# Patient Record
Sex: Male | Born: 1935 | Race: White | Hispanic: No | Marital: Married | Smoking: Never smoker
Health system: Southern US, Community
[De-identification: ages and names within clinical notes are randomized; demographics above are authoritative.]

## PROBLEM LIST (undated history)

## (undated) DIAGNOSIS — R42 Dizziness and giddiness: Secondary | ICD-10-CM

## (undated) DIAGNOSIS — H3320 Serous retinal detachment, unspecified eye: Secondary | ICD-10-CM

## (undated) DIAGNOSIS — K5790 Diverticulosis of intestine, part unspecified, without perforation or abscess without bleeding: Secondary | ICD-10-CM

## (undated) DIAGNOSIS — R001 Bradycardia, unspecified: Secondary | ICD-10-CM

## (undated) DIAGNOSIS — E785 Hyperlipidemia, unspecified: Secondary | ICD-10-CM

## (undated) HISTORY — DX: Bradycardia, unspecified: R00.1

## (undated) HISTORY — PX: CATARACT EXTRACTION: SUR2

## (undated) HISTORY — PX: RETINAL DETACHMENT SURGERY: SHX105

## (undated) HISTORY — DX: Dizziness and giddiness: R42

---

## 1997-11-08 ENCOUNTER — Encounter: Admission: RE | Admit: 1997-11-08 | Discharge: 1998-02-06 | Payer: Self-pay | Admitting: *Deleted

## 1999-02-20 ENCOUNTER — Encounter: Admission: RE | Admit: 1999-02-20 | Discharge: 1999-05-21 | Payer: Self-pay | Admitting: *Deleted

## 2002-04-21 ENCOUNTER — Ambulatory Visit (HOSPITAL_COMMUNITY): Admission: RE | Admit: 2002-04-21 | Discharge: 2002-04-21 | Payer: Self-pay | Admitting: Gastroenterology

## 2011-08-06 DIAGNOSIS — R059 Cough, unspecified: Secondary | ICD-10-CM | POA: Diagnosis not present

## 2011-08-06 DIAGNOSIS — R05 Cough: Secondary | ICD-10-CM | POA: Diagnosis not present

## 2011-09-24 DIAGNOSIS — H4011X0 Primary open-angle glaucoma, stage unspecified: Secondary | ICD-10-CM | POA: Diagnosis not present

## 2011-09-24 DIAGNOSIS — Z9889 Other specified postprocedural states: Secondary | ICD-10-CM | POA: Diagnosis not present

## 2011-09-24 DIAGNOSIS — H2510 Age-related nuclear cataract, unspecified eye: Secondary | ICD-10-CM | POA: Diagnosis not present

## 2011-09-26 DIAGNOSIS — H4011X0 Primary open-angle glaucoma, stage unspecified: Secondary | ICD-10-CM | POA: Diagnosis not present

## 2011-09-26 DIAGNOSIS — H2510 Age-related nuclear cataract, unspecified eye: Secondary | ICD-10-CM | POA: Diagnosis not present

## 2011-09-26 DIAGNOSIS — Z9889 Other specified postprocedural states: Secondary | ICD-10-CM | POA: Diagnosis not present

## 2011-11-21 DIAGNOSIS — H40019 Open angle with borderline findings, low risk, unspecified eye: Secondary | ICD-10-CM | POA: Diagnosis not present

## 2011-12-03 DIAGNOSIS — N400 Enlarged prostate without lower urinary tract symptoms: Secondary | ICD-10-CM | POA: Diagnosis not present

## 2011-12-03 DIAGNOSIS — K219 Gastro-esophageal reflux disease without esophagitis: Secondary | ICD-10-CM | POA: Diagnosis not present

## 2011-12-03 DIAGNOSIS — M542 Cervicalgia: Secondary | ICD-10-CM | POA: Diagnosis not present

## 2011-12-03 DIAGNOSIS — Z0000 Encounter for general adult medical examination without abnormal findings: Secondary | ICD-10-CM | POA: Diagnosis not present

## 2011-12-03 DIAGNOSIS — Z125 Encounter for screening for malignant neoplasm of prostate: Secondary | ICD-10-CM | POA: Diagnosis not present

## 2011-12-03 DIAGNOSIS — Z79899 Other long term (current) drug therapy: Secondary | ICD-10-CM | POA: Diagnosis not present

## 2011-12-03 DIAGNOSIS — E7800 Pure hypercholesterolemia, unspecified: Secondary | ICD-10-CM | POA: Diagnosis not present

## 2011-12-03 DIAGNOSIS — M109 Gout, unspecified: Secondary | ICD-10-CM | POA: Diagnosis not present

## 2011-12-17 DIAGNOSIS — Z9889 Other specified postprocedural states: Secondary | ICD-10-CM | POA: Diagnosis not present

## 2011-12-17 DIAGNOSIS — H2510 Age-related nuclear cataract, unspecified eye: Secondary | ICD-10-CM | POA: Diagnosis not present

## 2011-12-17 DIAGNOSIS — H409 Unspecified glaucoma: Secondary | ICD-10-CM | POA: Diagnosis not present

## 2011-12-17 DIAGNOSIS — H40019 Open angle with borderline findings, low risk, unspecified eye: Secondary | ICD-10-CM | POA: Diagnosis not present

## 2011-12-17 DIAGNOSIS — H4011X0 Primary open-angle glaucoma, stage unspecified: Secondary | ICD-10-CM | POA: Diagnosis not present

## 2012-01-29 DIAGNOSIS — M545 Low back pain: Secondary | ICD-10-CM | POA: Diagnosis not present

## 2012-02-20 DIAGNOSIS — M545 Low back pain: Secondary | ICD-10-CM | POA: Diagnosis not present

## 2012-04-18 DIAGNOSIS — Z23 Encounter for immunization: Secondary | ICD-10-CM | POA: Diagnosis not present

## 2012-05-16 DIAGNOSIS — M542 Cervicalgia: Secondary | ICD-10-CM | POA: Diagnosis not present

## 2012-05-22 DIAGNOSIS — M542 Cervicalgia: Secondary | ICD-10-CM | POA: Diagnosis not present

## 2012-05-23 DIAGNOSIS — H4011X0 Primary open-angle glaucoma, stage unspecified: Secondary | ICD-10-CM | POA: Diagnosis not present

## 2012-05-23 DIAGNOSIS — H2510 Age-related nuclear cataract, unspecified eye: Secondary | ICD-10-CM | POA: Diagnosis not present

## 2012-06-02 DIAGNOSIS — M9981 Other biomechanical lesions of cervical region: Secondary | ICD-10-CM | POA: Diagnosis not present

## 2012-06-02 DIAGNOSIS — M542 Cervicalgia: Secondary | ICD-10-CM | POA: Diagnosis not present

## 2012-06-04 DIAGNOSIS — M9981 Other biomechanical lesions of cervical region: Secondary | ICD-10-CM | POA: Diagnosis not present

## 2012-06-04 DIAGNOSIS — M542 Cervicalgia: Secondary | ICD-10-CM | POA: Diagnosis not present

## 2012-06-06 DIAGNOSIS — M542 Cervicalgia: Secondary | ICD-10-CM | POA: Diagnosis not present

## 2012-06-06 DIAGNOSIS — M9981 Other biomechanical lesions of cervical region: Secondary | ICD-10-CM | POA: Diagnosis not present

## 2012-06-23 DIAGNOSIS — M47812 Spondylosis without myelopathy or radiculopathy, cervical region: Secondary | ICD-10-CM | POA: Diagnosis not present

## 2012-06-23 DIAGNOSIS — M109 Gout, unspecified: Secondary | ICD-10-CM | POA: Diagnosis not present

## 2012-06-27 DIAGNOSIS — M47812 Spondylosis without myelopathy or radiculopathy, cervical region: Secondary | ICD-10-CM | POA: Diagnosis not present

## 2012-07-02 DIAGNOSIS — M47812 Spondylosis without myelopathy or radiculopathy, cervical region: Secondary | ICD-10-CM | POA: Diagnosis not present

## 2012-07-11 DIAGNOSIS — M47812 Spondylosis without myelopathy or radiculopathy, cervical region: Secondary | ICD-10-CM | POA: Diagnosis not present

## 2012-07-11 DIAGNOSIS — M109 Gout, unspecified: Secondary | ICD-10-CM | POA: Diagnosis not present

## 2012-07-14 DIAGNOSIS — H40019 Open angle with borderline findings, low risk, unspecified eye: Secondary | ICD-10-CM | POA: Diagnosis not present

## 2012-07-14 DIAGNOSIS — H4011X0 Primary open-angle glaucoma, stage unspecified: Secondary | ICD-10-CM | POA: Diagnosis not present

## 2012-07-14 DIAGNOSIS — Z9889 Other specified postprocedural states: Secondary | ICD-10-CM | POA: Diagnosis not present

## 2012-07-14 DIAGNOSIS — H2510 Age-related nuclear cataract, unspecified eye: Secondary | ICD-10-CM | POA: Diagnosis not present

## 2012-07-17 DIAGNOSIS — M47812 Spondylosis without myelopathy or radiculopathy, cervical region: Secondary | ICD-10-CM | POA: Diagnosis not present

## 2012-12-31 DIAGNOSIS — Z125 Encounter for screening for malignant neoplasm of prostate: Secondary | ICD-10-CM | POA: Diagnosis not present

## 2012-12-31 DIAGNOSIS — J309 Allergic rhinitis, unspecified: Secondary | ICD-10-CM | POA: Diagnosis not present

## 2012-12-31 DIAGNOSIS — Z79899 Other long term (current) drug therapy: Secondary | ICD-10-CM | POA: Diagnosis not present

## 2012-12-31 DIAGNOSIS — Z0000 Encounter for general adult medical examination without abnormal findings: Secondary | ICD-10-CM | POA: Diagnosis not present

## 2012-12-31 DIAGNOSIS — K219 Gastro-esophageal reflux disease without esophagitis: Secondary | ICD-10-CM | POA: Diagnosis not present

## 2012-12-31 DIAGNOSIS — R252 Cramp and spasm: Secondary | ICD-10-CM | POA: Diagnosis not present

## 2012-12-31 DIAGNOSIS — M109 Gout, unspecified: Secondary | ICD-10-CM | POA: Diagnosis not present

## 2012-12-31 DIAGNOSIS — E7800 Pure hypercholesterolemia, unspecified: Secondary | ICD-10-CM | POA: Diagnosis not present

## 2013-01-13 DIAGNOSIS — N509 Disorder of male genital organs, unspecified: Secondary | ICD-10-CM | POA: Diagnosis not present

## 2013-02-04 DIAGNOSIS — H4011X0 Primary open-angle glaucoma, stage unspecified: Secondary | ICD-10-CM | POA: Diagnosis not present

## 2013-03-11 DIAGNOSIS — H409 Unspecified glaucoma: Secondary | ICD-10-CM | POA: Diagnosis not present

## 2013-03-11 DIAGNOSIS — Z9889 Other specified postprocedural states: Secondary | ICD-10-CM | POA: Diagnosis not present

## 2013-03-11 DIAGNOSIS — H40019 Open angle with borderline findings, low risk, unspecified eye: Secondary | ICD-10-CM | POA: Diagnosis not present

## 2013-03-11 DIAGNOSIS — H4010X0 Unspecified open-angle glaucoma, stage unspecified: Secondary | ICD-10-CM | POA: Diagnosis not present

## 2013-03-11 DIAGNOSIS — H2510 Age-related nuclear cataract, unspecified eye: Secondary | ICD-10-CM | POA: Diagnosis not present

## 2013-03-11 DIAGNOSIS — H4011X0 Primary open-angle glaucoma, stage unspecified: Secondary | ICD-10-CM | POA: Diagnosis not present

## 2013-03-30 ENCOUNTER — Encounter (HOSPITAL_COMMUNITY): Payer: Self-pay | Admitting: Emergency Medicine

## 2013-03-30 ENCOUNTER — Emergency Department (HOSPITAL_COMMUNITY): Payer: Medicare Other

## 2013-03-30 ENCOUNTER — Emergency Department (HOSPITAL_COMMUNITY)
Admission: EM | Admit: 2013-03-30 | Discharge: 2013-03-30 | Disposition: A | Discharge status: Home / Self Care | Payer: Medicare Other | Attending: Emergency Medicine | Admitting: Emergency Medicine

## 2013-03-30 DIAGNOSIS — R0789 Other chest pain: Secondary | ICD-10-CM | POA: Diagnosis not present

## 2013-03-30 DIAGNOSIS — Z7982 Long term (current) use of aspirin: Secondary | ICD-10-CM | POA: Insufficient documentation

## 2013-03-30 DIAGNOSIS — Z79899 Other long term (current) drug therapy: Secondary | ICD-10-CM | POA: Diagnosis not present

## 2013-03-30 DIAGNOSIS — E785 Hyperlipidemia, unspecified: Secondary | ICD-10-CM | POA: Insufficient documentation

## 2013-03-30 DIAGNOSIS — R079 Chest pain, unspecified: Secondary | ICD-10-CM | POA: Diagnosis not present

## 2013-03-30 HISTORY — DX: Serous retinal detachment, unspecified eye: H33.20

## 2013-03-30 HISTORY — DX: Diverticulosis of intestine, part unspecified, without perforation or abscess without bleeding: K57.90

## 2013-03-30 HISTORY — DX: Hyperlipidemia, unspecified: E78.5

## 2013-03-30 LAB — CBC
Hemoglobin: 16 g/dL (ref 13.0–17.0)
MCV: 89.9 fL (ref 78.0–100.0)
Platelets: 225 10*3/uL (ref 150–400)
RBC: 4.95 MIL/uL (ref 4.22–5.81)
WBC: 8.9 10*3/uL (ref 4.0–10.5)

## 2013-03-30 LAB — POCT I-STAT TROPONIN I: Troponin i, poc: 0 ng/mL (ref 0.00–0.08)

## 2013-03-30 LAB — BASIC METABOLIC PANEL
CO2: 24 mEq/L (ref 19–32)
Chloride: 98 mEq/L (ref 96–112)
Glucose, Bld: 93 mg/dL (ref 70–99)
Sodium: 132 mEq/L — ABNORMAL LOW (ref 135–145)

## 2013-03-30 NOTE — ED Notes (Signed)
Patient is alert and orientedx4.  Patient was explained discharge instructions and they understood them with no questions.  The patient's family is taking the patient home. 

## 2013-03-30 NOTE — ED Provider Notes (Signed)
Care assumed from [place], PA-C at shift change. Patient with atypical chest pain. Initial troponin negative. Second set of cardiac enzymes pending, will check at 5:30. If normal, will discharge home with outpatient cardiology referral. No significant PMHx, low risk. 6:51 PM Second troponin negative. Patient is well-appearing and in no apparent distress. States he had one more short episode of chest pain for about 1 second, same as prior. Otherwise he is not complaining of any symptoms. Patient will be discharged home, close return precautions given. F/u with PCP and cardiology. Patient and family state understanding of plan and are agreeable.  Results for orders placed during the hospital encounter of 03/30/13  CBC      Result Value Range   WBC 8.9  4.0 - 10.5 K/uL   RBC 4.95  4.22 - 5.81 MIL/uL   Hemoglobin 16.0  13.0 - 17.0 g/dL   HCT 40.9  81.1 - 91.4 %   MCV 89.9  78.0 - 100.0 fL   MCH 32.3  26.0 - 34.0 pg   MCHC 36.0  30.0 - 36.0 g/dL   RDW 78.2  95.6 - 21.3 %   Platelets 225  150 - 400 K/uL  BASIC METABOLIC PANEL      Result Value Range   Sodium 132 (*) 135 - 145 mEq/L   Potassium 5.0  3.5 - 5.1 mEq/L   Chloride 98  96 - 112 mEq/L   CO2 24  19 - 32 mEq/L   Glucose, Bld 93  70 - 99 mg/dL   BUN 16  6 - 23 mg/dL   Creatinine, Ser 0.86  0.50 - 1.35 mg/dL   Calcium 9.7  8.4 - 57.8 mg/dL   GFR calc non Af Amer 71 (*) >90 mL/min   GFR calc Af Amer 83 (*) >90 mL/min  POCT I-STAT TROPONIN I      Result Value Range   Troponin i, poc 0.00  0.00 - 0.08 ng/mL   Comment 3           POCT I-STAT TROPONIN I      Result Value Range   Troponin i, poc 0.00  0.00 - 0.08 ng/mL   Comment 3              Trevor Mace, [place] 03/30/13 4696

## 2013-03-30 NOTE — ED Provider Notes (Signed)
CSN: [id number]     Arrival date & time 03/30/13  1359 History   First MD Initiated Contact with Patient 03/30/13 1400     Chief Complaint  Patient presents with  . Chest Pain   (Consider location/radiation/quality/duration/timing/severity/associated sxs/prior Treatment) Patient is a 77 y.o. male presenting with chest pain. The history is provided by the patient and the spouse.  Chest Pain Pain location:  L chest Pain quality: sharp and stabbing   Pain radiates to:  Does not radiate Pain radiates to the back: no   Pain severity:  Moderate Onset quality:  Sudden Duration: Pain lasts for a few seconds. Timing:  Intermittent Chronicity:  New Associated symptoms: no fever and no shortness of breath   Associated symptoms comment:  The patient woke this morning around 2 or 3 am to use the bathroom and experienced sudden sharp left chest pain lasting seconds before completely resolving. He has had multiple episodes of similar pain every couple of hours since that time. No SOB, nausea. No history of cardiac conditions. No cough or recent fever. Currently pain free.    Past Medical History  Diagnosis Date  . Hyperlipidemia   . Diverticulosis   . Detached retina    History reviewed. No pertinent past surgical history. No family history on file. History  Substance Use Topics  . Smoking status: Not on file  . Smokeless tobacco: Not on file  . Alcohol Use: Not on file    Review of Systems  Constitutional: Negative for fever and chills.  HENT: Negative.   Respiratory: Negative.  Negative for shortness of breath.   Cardiovascular: Positive for chest pain.  Gastrointestinal: Negative.   Musculoskeletal: Negative.   Skin: Negative.   Neurological: Negative.     Allergies  Vicodin  Home Medications   Current Outpatient Rx  Name  Route  Sig  Dispense  Refill  . allopurinol (ZYLOPRIM) 100 MG tablet   Oral   Take 100 mg by mouth daily.         Marland Kitchen aspirin EC 325 MG tablet    Oral   Take 325 mg by mouth daily.         . cholecalciferol (VITAMIN D-400) 400 UNITS TABS tablet   Oral   Take 400 Units by mouth daily.         Marland Kitchen ibuprofen (ADVIL,MOTRIN) 200 MG tablet   Oral   Take 800 mg by mouth every 8 (eight) hours as needed for pain.         . indomethacin (INDOCIN) 25 MG capsule   Oral   Take 25 mg by mouth 3 (three) times daily with meals as needed (for gout).         . Multiple Vitamin (MULTIVITAMIN WITH MINERALS) TABS tablet   Oral   Take 1 tablet by mouth daily.         . Omega-3 Fatty Acids (FISH OIL) 1000 MG CAPS   Oral   Take 1,000 mg by mouth daily.         Marland Kitchen omeprazole (PRILOSEC) 20 MG capsule   Oral   Take 20 mg by mouth daily.         . Red Yeast Rice 600 MG CAPS   Oral   Take 600 mg by mouth 2 (two) times daily.         . timolol (BETIMOL) 0.5 % ophthalmic solution   Both Eyes   Place 1 drop into both eyes daily.         Marland Kitchen  vitamin C (ASCORBIC ACID) 250 MG tablet   Oral   Take 250 mg by mouth daily.         . vitamin E 400 UNIT capsule   Oral   Take 400 Units by mouth daily.          BP 158/81  Pulse 51  Temp(Src) 97.7 F (36.5 C) (Oral)  SpO2 100% Physical Exam  Constitutional: He is oriented to person, place, and time. He appears well-developed and well-nourished. No distress.  HENT:  Head: Normocephalic.  Neck: Normal range of motion. Neck supple.  Cardiovascular: Normal rate and regular rhythm.   Pulmonary/Chest: Effort normal and breath sounds normal. He has no wheezes. He has no rales. He exhibits no tenderness.  Abdominal: Soft. Bowel sounds are normal. There is no tenderness. There is no rebound and no guarding.  Musculoskeletal: Normal range of motion. He exhibits no edema.  Neurological: He is alert and oriented to person, place, and time.  Skin: Skin is warm and dry. No rash noted.  Psychiatric: He has a normal mood and affect.    ED Course  Procedures (including critical care  time) Labs Review Labs Reviewed  CBC  BASIC METABOLIC PANEL   Results for orders placed during the hospital encounter of 03/30/13  CBC      Result Value Range   WBC 8.9  4.0 - 10.5 K/uL   RBC 4.95  4.22 - 5.81 MIL/uL   Hemoglobin 16.0  13.0 - 17.0 g/dL   HCT 16.1  09.6 - 04.5 %   MCV 89.9  78.0 - 100.0 fL   MCH 32.3  26.0 - 34.0 pg   MCHC 36.0  30.0 - 36.0 g/dL   RDW 40.9  81.1 - 91.4 %   Platelets 225  150 - 400 K/uL    Date: 03/30/2013  Rate: 45  Rhythm: sinus bradycardia  QRS Axis: normal  Intervals: PR prolonged  ST/T Wave abnormalities: normal  Conduction Disutrbances:none  Narrative Interpretation:   Old EKG Reviewed: unchanged   Imaging Review Dg Chest Port 1 View  03/30/2013   CLINICAL DATA:  Chest pain  EXAM: PORTABLE CHEST - 1 VIEW  COMPARISON:  None.  FINDINGS: The heart size and mediastinal contours are within normal limits. Both lungs are clear. The visualized skeletal structures are unremarkable.  IMPRESSION: No active disease.   Electronically Signed   By: Charlett Nose M.D.   On: 03/30/2013 14:55    MDM  No diagnosis found. 1. Chest pain, atypical  Plan:  Two sets cardiac enzymes. Anticipate d/c home with cardiology referral.     Arnoldo Hooker, PA-C 03/30/13 1533

## 2013-03-30 NOTE — ED Notes (Signed)
Onset today at 0300 developed chest pain pressure fell back to sleep and woke up. Moved a ladder and developed chest pain pressure again.  Seen at [facility] or sent patient to ED for evaluation.  Currently denies chest pain.  Alert answering and following commands appropriate.

## 2013-03-30 NOTE — ED Provider Notes (Signed)
Medical screening examination/treatment/procedure(s) were performed by non-physician practitioner and as supervising physician I was immediately available for consultation/collaboration.    Celene Kras, MD 03/30/13 1600

## 2013-03-31 NOTE — ED Provider Notes (Signed)
Medical screening examination/treatment/procedure(s) were performed by non-physician practitioner and as supervising physician I was immediately available for consultation/collaboration.  Kaceton Vieau L Jonique Kulig, MD 03/31/13 1554 

## 2013-04-23 DIAGNOSIS — Z23 Encounter for immunization: Secondary | ICD-10-CM | POA: Diagnosis not present

## 2013-05-18 DIAGNOSIS — M25569 Pain in unspecified knee: Secondary | ICD-10-CM | POA: Diagnosis not present

## 2013-05-21 DIAGNOSIS — H4011X0 Primary open-angle glaucoma, stage unspecified: Secondary | ICD-10-CM | POA: Diagnosis not present

## 2013-05-21 DIAGNOSIS — Z9889 Other specified postprocedural states: Secondary | ICD-10-CM | POA: Diagnosis not present

## 2013-05-21 DIAGNOSIS — H40019 Open angle with borderline findings, low risk, unspecified eye: Secondary | ICD-10-CM | POA: Diagnosis not present

## 2013-05-21 DIAGNOSIS — H2510 Age-related nuclear cataract, unspecified eye: Secondary | ICD-10-CM | POA: Diagnosis not present

## 2013-06-26 DIAGNOSIS — H40019 Open angle with borderline findings, low risk, unspecified eye: Secondary | ICD-10-CM | POA: Diagnosis not present

## 2013-06-26 DIAGNOSIS — H2510 Age-related nuclear cataract, unspecified eye: Secondary | ICD-10-CM | POA: Diagnosis not present

## 2013-06-26 DIAGNOSIS — H4011X0 Primary open-angle glaucoma, stage unspecified: Secondary | ICD-10-CM | POA: Diagnosis not present

## 2013-06-26 DIAGNOSIS — Z9889 Other specified postprocedural states: Secondary | ICD-10-CM | POA: Diagnosis not present

## 2013-10-14 DIAGNOSIS — L819 Disorder of pigmentation, unspecified: Secondary | ICD-10-CM | POA: Diagnosis not present

## 2013-10-14 DIAGNOSIS — H409 Unspecified glaucoma: Secondary | ICD-10-CM | POA: Diagnosis not present

## 2013-10-14 DIAGNOSIS — R51 Headache: Secondary | ICD-10-CM | POA: Diagnosis not present

## 2013-10-14 DIAGNOSIS — H2510 Age-related nuclear cataract, unspecified eye: Secondary | ICD-10-CM | POA: Diagnosis not present

## 2013-10-14 DIAGNOSIS — L738 Other specified follicular disorders: Secondary | ICD-10-CM | POA: Diagnosis not present

## 2013-10-14 DIAGNOSIS — H40019 Open angle with borderline findings, low risk, unspecified eye: Secondary | ICD-10-CM | POA: Diagnosis not present

## 2013-10-14 DIAGNOSIS — D235 Other benign neoplasm of skin of trunk: Secondary | ICD-10-CM | POA: Diagnosis not present

## 2013-10-14 DIAGNOSIS — B351 Tinea unguium: Secondary | ICD-10-CM | POA: Diagnosis not present

## 2013-10-14 DIAGNOSIS — L821 Other seborrheic keratosis: Secondary | ICD-10-CM | POA: Diagnosis not present

## 2013-10-14 DIAGNOSIS — H4011X0 Primary open-angle glaucoma, stage unspecified: Secondary | ICD-10-CM | POA: Diagnosis not present

## 2013-10-14 DIAGNOSIS — L570 Actinic keratosis: Secondary | ICD-10-CM | POA: Diagnosis not present

## 2013-10-14 DIAGNOSIS — Z8669 Personal history of other diseases of the nervous system and sense organs: Secondary | ICD-10-CM | POA: Diagnosis not present

## 2014-01-01 DIAGNOSIS — Z79899 Other long term (current) drug therapy: Secondary | ICD-10-CM | POA: Diagnosis not present

## 2014-01-01 DIAGNOSIS — E7800 Pure hypercholesterolemia, unspecified: Secondary | ICD-10-CM | POA: Diagnosis not present

## 2014-01-01 DIAGNOSIS — M109 Gout, unspecified: Secondary | ICD-10-CM | POA: Diagnosis not present

## 2014-01-01 DIAGNOSIS — I498 Other specified cardiac arrhythmias: Secondary | ICD-10-CM | POA: Diagnosis not present

## 2014-01-01 DIAGNOSIS — Z23 Encounter for immunization: Secondary | ICD-10-CM | POA: Diagnosis not present

## 2014-01-01 DIAGNOSIS — R252 Cramp and spasm: Secondary | ICD-10-CM | POA: Diagnosis not present

## 2014-01-01 DIAGNOSIS — Z0000 Encounter for general adult medical examination without abnormal findings: Secondary | ICD-10-CM | POA: Diagnosis not present

## 2014-01-01 DIAGNOSIS — K219 Gastro-esophageal reflux disease without esophagitis: Secondary | ICD-10-CM | POA: Diagnosis not present

## 2014-01-04 DIAGNOSIS — Z79899 Other long term (current) drug therapy: Secondary | ICD-10-CM | POA: Diagnosis not present

## 2014-01-04 DIAGNOSIS — I498 Other specified cardiac arrhythmias: Secondary | ICD-10-CM | POA: Diagnosis not present

## 2014-01-04 DIAGNOSIS — Z0000 Encounter for general adult medical examination without abnormal findings: Secondary | ICD-10-CM | POA: Diagnosis not present

## 2014-01-04 DIAGNOSIS — M109 Gout, unspecified: Secondary | ICD-10-CM | POA: Diagnosis not present

## 2014-01-04 DIAGNOSIS — K219 Gastro-esophageal reflux disease without esophagitis: Secondary | ICD-10-CM | POA: Diagnosis not present

## 2014-01-04 DIAGNOSIS — R252 Cramp and spasm: Secondary | ICD-10-CM | POA: Diagnosis not present

## 2014-01-04 DIAGNOSIS — Z23 Encounter for immunization: Secondary | ICD-10-CM | POA: Diagnosis not present

## 2014-01-04 DIAGNOSIS — E7800 Pure hypercholesterolemia, unspecified: Secondary | ICD-10-CM | POA: Diagnosis not present

## 2014-01-11 DIAGNOSIS — H40019 Open angle with borderline findings, low risk, unspecified eye: Secondary | ICD-10-CM | POA: Diagnosis not present

## 2014-01-11 DIAGNOSIS — H2510 Age-related nuclear cataract, unspecified eye: Secondary | ICD-10-CM | POA: Diagnosis not present

## 2014-01-11 DIAGNOSIS — H409 Unspecified glaucoma: Secondary | ICD-10-CM | POA: Diagnosis not present

## 2014-01-11 DIAGNOSIS — Z9889 Other specified postprocedural states: Secondary | ICD-10-CM | POA: Diagnosis not present

## 2014-01-11 DIAGNOSIS — H4011X0 Primary open-angle glaucoma, stage unspecified: Secondary | ICD-10-CM | POA: Diagnosis not present

## 2014-01-29 DIAGNOSIS — H2510 Age-related nuclear cataract, unspecified eye: Secondary | ICD-10-CM | POA: Diagnosis not present

## 2014-02-22 DIAGNOSIS — H269 Unspecified cataract: Secondary | ICD-10-CM | POA: Diagnosis not present

## 2014-02-25 DIAGNOSIS — H269 Unspecified cataract: Secondary | ICD-10-CM | POA: Diagnosis not present

## 2014-02-25 DIAGNOSIS — K219 Gastro-esophageal reflux disease without esophagitis: Secondary | ICD-10-CM | POA: Diagnosis not present

## 2014-02-25 DIAGNOSIS — H2510 Age-related nuclear cataract, unspecified eye: Secondary | ICD-10-CM | POA: Diagnosis not present

## 2014-02-25 DIAGNOSIS — Z7982 Long term (current) use of aspirin: Secondary | ICD-10-CM | POA: Diagnosis not present

## 2014-02-25 DIAGNOSIS — H2589 Other age-related cataract: Secondary | ICD-10-CM | POA: Diagnosis not present

## 2014-04-22 DIAGNOSIS — Z23 Encounter for immunization: Secondary | ICD-10-CM | POA: Diagnosis not present

## 2014-05-17 DIAGNOSIS — H40011 Open angle with borderline findings, low risk, right eye: Secondary | ICD-10-CM | POA: Diagnosis not present

## 2014-05-17 DIAGNOSIS — H4011X4 Primary open-angle glaucoma, indeterminate stage: Secondary | ICD-10-CM | POA: Diagnosis not present

## 2014-07-26 DIAGNOSIS — R1013 Epigastric pain: Secondary | ICD-10-CM | POA: Diagnosis not present

## 2014-07-26 DIAGNOSIS — M25569 Pain in unspecified knee: Secondary | ICD-10-CM | POA: Diagnosis not present

## 2014-07-28 ENCOUNTER — Other Ambulatory Visit: Payer: Self-pay | Admitting: Family Medicine

## 2014-07-28 DIAGNOSIS — R109 Unspecified abdominal pain: Secondary | ICD-10-CM

## 2014-08-02 ENCOUNTER — Ambulatory Visit
Admission: RE | Admit: 2014-08-02 | Discharge: 2014-08-02 | Disposition: A | Discharge status: Home / Self Care | Payer: Medicare Other | Source: Ambulatory Visit | Attending: Family Medicine | Admitting: Family Medicine

## 2014-08-02 DIAGNOSIS — R109 Unspecified abdominal pain: Secondary | ICD-10-CM | POA: Diagnosis not present

## 2014-08-02 DIAGNOSIS — N281 Cyst of kidney, acquired: Secondary | ICD-10-CM | POA: Diagnosis not present

## 2014-08-02 DIAGNOSIS — K219 Gastro-esophageal reflux disease without esophagitis: Secondary | ICD-10-CM | POA: Diagnosis not present

## 2014-08-02 DIAGNOSIS — K449 Diaphragmatic hernia without obstruction or gangrene: Secondary | ICD-10-CM | POA: Diagnosis not present

## 2014-08-04 ENCOUNTER — Other Ambulatory Visit: Payer: Self-pay | Admitting: Gastroenterology

## 2014-08-04 DIAGNOSIS — R933 Abnormal findings on diagnostic imaging of other parts of digestive tract: Secondary | ICD-10-CM | POA: Diagnosis not present

## 2014-08-05 ENCOUNTER — Ambulatory Visit
Admission: RE | Admit: 2014-08-05 | Discharge: 2014-08-05 | Disposition: A | Discharge status: Home / Self Care | Payer: Medicare Other | Source: Ambulatory Visit | Attending: Gastroenterology | Admitting: Gastroenterology

## 2014-08-05 DIAGNOSIS — R933 Abnormal findings on diagnostic imaging of other parts of digestive tract: Secondary | ICD-10-CM

## 2014-08-05 DIAGNOSIS — R140 Abdominal distension (gaseous): Secondary | ICD-10-CM | POA: Diagnosis not present

## 2014-08-05 DIAGNOSIS — R198 Other specified symptoms and signs involving the digestive system and abdomen: Secondary | ICD-10-CM | POA: Diagnosis not present

## 2014-08-05 DIAGNOSIS — R935 Abnormal findings on diagnostic imaging of other abdominal regions, including retroperitoneum: Secondary | ICD-10-CM | POA: Diagnosis not present

## 2014-08-05 DIAGNOSIS — K868 Other specified diseases of pancreas: Secondary | ICD-10-CM | POA: Diagnosis not present

## 2014-08-05 MED ORDER — GADOBENATE DIMEGLUMINE 529 MG/ML IV SOLN
16.0000 mL | Freq: Once | INTRAVENOUS | Status: AC | PRN
  Administered 2014-08-05: 16 mL via INTRAVENOUS

## 2014-11-17 DIAGNOSIS — H4011X4 Primary open-angle glaucoma, indeterminate stage: Secondary | ICD-10-CM | POA: Diagnosis not present

## 2014-11-17 DIAGNOSIS — H2512 Age-related nuclear cataract, left eye: Secondary | ICD-10-CM | POA: Diagnosis not present

## 2014-11-17 DIAGNOSIS — H26491 Other secondary cataract, right eye: Secondary | ICD-10-CM | POA: Diagnosis not present

## 2014-11-17 DIAGNOSIS — Z961 Presence of intraocular lens: Secondary | ICD-10-CM | POA: Diagnosis not present

## 2014-11-18 DIAGNOSIS — H40011 Open angle with borderline findings, low risk, right eye: Secondary | ICD-10-CM | POA: Diagnosis not present

## 2014-11-30 DIAGNOSIS — M1712 Unilateral primary osteoarthritis, left knee: Secondary | ICD-10-CM | POA: Diagnosis not present

## 2014-11-30 DIAGNOSIS — M1711 Unilateral primary osteoarthritis, right knee: Secondary | ICD-10-CM | POA: Diagnosis not present

## 2014-11-30 DIAGNOSIS — M19071 Primary osteoarthritis, right ankle and foot: Secondary | ICD-10-CM | POA: Diagnosis not present

## 2015-01-20 DIAGNOSIS — M25561 Pain in right knee: Secondary | ICD-10-CM | POA: Diagnosis not present

## 2015-01-20 DIAGNOSIS — M25562 Pain in left knee: Secondary | ICD-10-CM | POA: Diagnosis not present

## 2015-01-20 DIAGNOSIS — M25571 Pain in right ankle and joints of right foot: Secondary | ICD-10-CM | POA: Diagnosis not present

## 2015-01-27 ENCOUNTER — Other Ambulatory Visit: Payer: Self-pay | Admitting: Family Medicine

## 2015-01-27 DIAGNOSIS — M109 Gout, unspecified: Secondary | ICD-10-CM | POA: Diagnosis not present

## 2015-01-27 DIAGNOSIS — Z0001 Encounter for general adult medical examination with abnormal findings: Secondary | ICD-10-CM | POA: Diagnosis not present

## 2015-01-27 DIAGNOSIS — R42 Dizziness and giddiness: Secondary | ICD-10-CM | POA: Diagnosis not present

## 2015-01-27 DIAGNOSIS — K5900 Constipation, unspecified: Secondary | ICD-10-CM | POA: Diagnosis not present

## 2015-01-27 DIAGNOSIS — E780 Pure hypercholesterolemia: Secondary | ICD-10-CM | POA: Diagnosis not present

## 2015-01-27 DIAGNOSIS — K219 Gastro-esophageal reflux disease without esophagitis: Secondary | ICD-10-CM | POA: Diagnosis not present

## 2015-01-27 DIAGNOSIS — N400 Enlarged prostate without lower urinary tract symptoms: Secondary | ICD-10-CM | POA: Diagnosis not present

## 2015-01-27 DIAGNOSIS — Z79899 Other long term (current) drug therapy: Secondary | ICD-10-CM | POA: Diagnosis not present

## 2015-01-31 ENCOUNTER — Ambulatory Visit
Admission: RE | Admit: 2015-01-31 | Discharge: 2015-01-31 | Disposition: A | Discharge status: Home / Self Care | Payer: Medicare Other | Source: Ambulatory Visit | Attending: Family Medicine | Admitting: Family Medicine

## 2015-01-31 ENCOUNTER — Other Ambulatory Visit: Payer: Self-pay | Admitting: Family Medicine

## 2015-01-31 DIAGNOSIS — R42 Dizziness and giddiness: Secondary | ICD-10-CM

## 2015-01-31 DIAGNOSIS — R51 Headache: Secondary | ICD-10-CM | POA: Diagnosis not present

## 2015-01-31 DIAGNOSIS — I6521 Occlusion and stenosis of right carotid artery: Secondary | ICD-10-CM | POA: Diagnosis not present

## 2015-01-31 DIAGNOSIS — E042 Nontoxic multinodular goiter: Secondary | ICD-10-CM | POA: Diagnosis not present

## 2015-02-04 DIAGNOSIS — Z961 Presence of intraocular lens: Secondary | ICD-10-CM | POA: Diagnosis not present

## 2015-02-09 DIAGNOSIS — H40011 Open angle with borderline findings, low risk, right eye: Secondary | ICD-10-CM | POA: Diagnosis not present

## 2015-02-09 DIAGNOSIS — Z9889 Other specified postprocedural states: Secondary | ICD-10-CM | POA: Diagnosis not present

## 2015-02-09 DIAGNOSIS — H4011X4 Primary open-angle glaucoma, indeterminate stage: Secondary | ICD-10-CM | POA: Diagnosis not present

## 2015-02-23 DIAGNOSIS — R351 Nocturia: Secondary | ICD-10-CM | POA: Diagnosis not present

## 2015-02-23 DIAGNOSIS — R001 Bradycardia, unspecified: Secondary | ICD-10-CM | POA: Diagnosis not present

## 2015-02-23 DIAGNOSIS — I779 Disorder of arteries and arterioles, unspecified: Secondary | ICD-10-CM | POA: Diagnosis not present

## 2015-02-23 DIAGNOSIS — E041 Nontoxic single thyroid nodule: Secondary | ICD-10-CM | POA: Diagnosis not present

## 2015-02-28 ENCOUNTER — Ambulatory Visit (INDEPENDENT_AMBULATORY_CARE_PROVIDER_SITE_OTHER): Payer: Medicare Other | Admitting: Cardiovascular Disease

## 2015-02-28 ENCOUNTER — Encounter: Payer: Self-pay | Admitting: Cardiovascular Disease

## 2015-02-28 ENCOUNTER — Telehealth: Payer: Self-pay | Admitting: Cardiovascular Disease

## 2015-02-28 VITALS — BP 112/70 | HR 56 | Ht 70.0 in | Wt 176.5 lb

## 2015-02-28 DIAGNOSIS — R001 Bradycardia, unspecified: Secondary | ICD-10-CM | POA: Diagnosis not present

## 2015-02-28 DIAGNOSIS — R42 Dizziness and giddiness: Secondary | ICD-10-CM

## 2015-02-28 NOTE — Assessment & Plan Note (Signed)
Charles Mccarthy was referred to me by Dr. Harrington Challenger for dizziness and bradycardia. He has been a long time jogger in the past. He has noticed his heart rate in the 40s. His major complaints are dizziness when he changes positions quickly as well as when he moves his head from down to up. MRA of his head and neck were unremarkable as were carotid Dopplers. His heart rate is in the 40s on a 12 EKG that was provided by his primary care physician. He has not had presyncope. I doubt whether symptoms are rate related. I am going to get 2-D echo and a two-week event monitor. I will refer him to Dr. Sallyanne Kuster for further evaluation of this.

## 2015-02-28 NOTE — Telephone Encounter (Signed)
Received records from [facility] for appointment on 02/28/15 with Dr Gwenlyn Found.  Records given to [facility] (medical records) for Dr Kennon Holter schedule on 02/28/15. lp

## 2015-02-28 NOTE — Patient Instructions (Addendum)
Your physician has requested that you have an echocardiogram. Echocardiography is a painless test that uses sound waves to create images of your heart. It provides your doctor with information about the size and shape of your heart and how well your heart's chambers and valves are working. This procedure takes approximately one hour. There are no restrictions for this procedure.   Your physician has recommended that you wear a 2 WEEK event monitor. Event monitors are medical devices that record the heart's electrical activity. Doctors most often [place] these monitors to diagnose arrhythmias. Arrhythmias are problems with the speed or rhythm of the heartbeat. The monitor is a small, portable device. You can wear one while you do your normal daily activities. This is usually used to diagnose what is causing palpitations/syncope (passing out).  Dr Gwenlyn Found recommends that you schedule a follow-up appointment first available with Dr Sallyanne Kuster to discuss pacemaker insertion.   Your physician recommends that you schedule a follow-up appointment in: AS NEEDED

## 2015-02-28 NOTE — Progress Notes (Signed)
02/28/2015 Charles Mccarthy   1935/10/01  [id number]  Primary Physician Harle Battiest, MD Primary Cardiologist: Lorretta Harp MD Renae Gloss   HPI:  Charles Mccarthy is a delightful 79 year old thin-appearing married Caucasian male father of 3 children, grandfather of 4 grandchildren who is retired from the [employer] in [employer] where he worked 31 years. He was referred by Dr. Harrington Challenger for cardiovascular evaluation because of bradycardia and dizziness. He has no cardiac risk factors. He's never had a heart attack and stroke, and denies chest pain or shortness of breath. His major complaint is of occasional dizziness when he changes from sitting to standing or moves his head quickly. He also has a resting bradycardia in the 40s which gets up to the 60s with exertion. He's never had syncope or presyncope.   Current Outpatient Prescriptions  Medication Sig Dispense Refill  . allopurinol (ZYLOPRIM) 100 MG tablet Take 100 mg by mouth daily.    Marland Kitchen aspirin EC 325 MG tablet Take 325 mg by mouth daily.    . cholecalciferol (VITAMIN D-400) 400 UNITS TABS tablet Take 400 Units by mouth daily.    Marland Kitchen ibuprofen (ADVIL,MOTRIN) 200 MG tablet Take 800 mg by mouth every 8 (eight) hours as needed for pain.    . indomethacin (INDOCIN) 25 MG capsule Take 25 mg by mouth 3 (three) times daily with meals as needed (for gout).    . Multiple Vitamin (MULTIVITAMIN WITH MINERALS) TABS tablet Take 1 tablet by mouth daily.    . Omega-3 Fatty Acids (FISH OIL) 1000 MG CAPS Take 1,000 mg by mouth daily.    Marland Kitchen omeprazole (PRILOSEC) 20 MG capsule Take 20 mg by mouth daily.    . Red Yeast Rice 600 MG CAPS Take 600 mg by mouth 2 (two) times daily.    . vitamin C (ASCORBIC ACID) 250 MG tablet Take 250 mg by mouth daily.    . vitamin E 400 UNIT capsule Take 400 Units by mouth daily.     No current facility-administered medications for this visit.    Allergies  Allergen Reactions  . Vicodin  [Hydrocodone-Acetaminophen] Other (See Comments)    Unknown     Social History   Social History  . Marital Status: Married    Spouse Name: N/A  . Number of Children: N/A  . Years of Education: N/A   Occupational History  . Not on file.   Social History Main Topics  . Smoking status: Never Smoker   . Smokeless tobacco: Never Used  . Alcohol Use: 2.4 oz/week    4 Standard drinks or equivalent per week  . Drug Use: No  . Sexual Activity: Not on file   Other Topics Concern  . Not on file   Social History Narrative     Review of Systems: General: negative for chills, fever, night sweats or weight changes.  Cardiovascular: negative for chest pain, dyspnea on exertion, edema, orthopnea, palpitations, paroxysmal nocturnal dyspnea or shortness of breath Dermatological: negative for rash Respiratory: negative for cough or wheezing Urologic: negative for hematuria Abdominal: negative for nausea, vomiting, diarrhea, bright red blood per rectum, melena, or hematemesis Neurologic: negative for visual changes, syncope, or dizziness All other systems reviewed and are otherwise negative except as noted above.    Blood pressure 112/70, pulse 56, height 5\' 10"  (1.778 m), weight 176 lb 8 oz (80.06 kg).  General appearance: alert and no distress Neck: no adenopathy, no carotid bruit, no JVD, supple, symmetrical, trachea midline  and thyroid not enlarged, symmetric, no tenderness/mass/nodules Lungs: clear to auscultation bilaterally Heart: regular rate and rhythm, S1, S2 normal, no murmur, click, rub or gallop Extremities: extremities normal, atraumatic, no cyanosis or edema  EKG not performed today  ASSESSMENT AND PLAN:   Dizziness Charles Mccarthy was referred to me by Dr. Harrington Challenger for dizziness and bradycardia. He has been a long time jogger in the past. He has noticed his heart rate in the 40s. His major complaints are dizziness when he changes positions quickly as well as when he moves his  head from down to up. MRA of his head and neck were unremarkable as were carotid Dopplers. His heart rate is in the 40s on a 12 EKG that was provided by his primary care physician. He has not had presyncope. I doubt whether symptoms are rate related. I am going to get 2-D echo and a two-week event monitor. I will refer him to Dr. Sallyanne Kuster for further evaluation of this.      Lorretta Harp MD FACP,FACC,FAHA, [facility] 02/28/2015 4:14 PM

## 2015-03-01 ENCOUNTER — Other Ambulatory Visit: Payer: Self-pay | Admitting: Family Medicine

## 2015-03-01 DIAGNOSIS — E041 Nontoxic single thyroid nodule: Secondary | ICD-10-CM

## 2015-03-03 ENCOUNTER — Ambulatory Visit
Admission: RE | Admit: 2015-03-03 | Discharge: 2015-03-03 | Disposition: A | Discharge status: Home / Self Care | Payer: Medicare Other | Source: Ambulatory Visit | Attending: Family Medicine | Admitting: Family Medicine

## 2015-03-03 DIAGNOSIS — E041 Nontoxic single thyroid nodule: Secondary | ICD-10-CM

## 2015-03-03 DIAGNOSIS — E042 Nontoxic multinodular goiter: Secondary | ICD-10-CM | POA: Diagnosis not present

## 2015-04-15 ENCOUNTER — Ambulatory Visit: Payer: Medicare Other | Admitting: Cardiovascular Disease

## 2015-04-22 DIAGNOSIS — K219 Gastro-esophageal reflux disease without esophagitis: Secondary | ICD-10-CM | POA: Diagnosis not present

## 2015-04-22 DIAGNOSIS — K5900 Constipation, unspecified: Secondary | ICD-10-CM | POA: Diagnosis not present

## 2015-04-22 DIAGNOSIS — R933 Abnormal findings on diagnostic imaging of other parts of digestive tract: Secondary | ICD-10-CM | POA: Diagnosis not present

## 2015-04-25 DIAGNOSIS — Z23 Encounter for immunization: Secondary | ICD-10-CM | POA: Diagnosis not present

## 2015-04-28 DIAGNOSIS — M1712 Unilateral primary osteoarthritis, left knee: Secondary | ICD-10-CM | POA: Diagnosis not present

## 2015-06-21 DIAGNOSIS — K8689 Other specified diseases of pancreas: Secondary | ICD-10-CM | POA: Diagnosis not present

## 2015-06-21 DIAGNOSIS — K219 Gastro-esophageal reflux disease without esophagitis: Secondary | ICD-10-CM | POA: Diagnosis not present

## 2015-06-21 DIAGNOSIS — Z86010 Personal history of colonic polyps: Secondary | ICD-10-CM | POA: Diagnosis not present

## 2015-06-24 DIAGNOSIS — Z86010 Personal history of colonic polyps: Secondary | ICD-10-CM | POA: Diagnosis not present

## 2015-06-24 DIAGNOSIS — K219 Gastro-esophageal reflux disease without esophagitis: Secondary | ICD-10-CM | POA: Diagnosis not present

## 2015-06-24 DIAGNOSIS — K8689 Other specified diseases of pancreas: Secondary | ICD-10-CM | POA: Diagnosis not present

## 2015-08-09 DIAGNOSIS — D225 Melanocytic nevi of trunk: Secondary | ICD-10-CM | POA: Diagnosis not present

## 2015-08-09 DIAGNOSIS — L814 Other melanin hyperpigmentation: Secondary | ICD-10-CM | POA: Diagnosis not present

## 2015-08-09 DIAGNOSIS — L570 Actinic keratosis: Secondary | ICD-10-CM | POA: Diagnosis not present

## 2015-08-09 DIAGNOSIS — D0461 Carcinoma in situ of skin of right upper limb, including shoulder: Secondary | ICD-10-CM | POA: Diagnosis not present

## 2015-08-09 DIAGNOSIS — C44622 Squamous cell carcinoma of skin of right upper limb, including shoulder: Secondary | ICD-10-CM | POA: Diagnosis not present

## 2015-08-09 DIAGNOSIS — D2271 Melanocytic nevi of right lower limb, including hip: Secondary | ICD-10-CM | POA: Diagnosis not present

## 2015-08-09 DIAGNOSIS — C44529 Squamous cell carcinoma of skin of other part of trunk: Secondary | ICD-10-CM | POA: Diagnosis not present

## 2015-08-09 DIAGNOSIS — D1801 Hemangioma of skin and subcutaneous tissue: Secondary | ICD-10-CM | POA: Diagnosis not present

## 2015-08-09 DIAGNOSIS — C44629 Squamous cell carcinoma of skin of left upper limb, including shoulder: Secondary | ICD-10-CM | POA: Diagnosis not present

## 2015-08-09 DIAGNOSIS — Z85828 Personal history of other malignant neoplasm of skin: Secondary | ICD-10-CM | POA: Diagnosis not present

## 2015-09-28 DIAGNOSIS — J309 Allergic rhinitis, unspecified: Secondary | ICD-10-CM | POA: Diagnosis not present

## 2015-09-28 DIAGNOSIS — R52 Pain, unspecified: Secondary | ICD-10-CM | POA: Diagnosis not present

## 2015-10-05 DIAGNOSIS — H401124 Primary open-angle glaucoma, left eye, indeterminate stage: Secondary | ICD-10-CM | POA: Diagnosis not present

## 2015-10-10 DIAGNOSIS — H2512 Age-related nuclear cataract, left eye: Secondary | ICD-10-CM | POA: Diagnosis not present

## 2015-10-10 DIAGNOSIS — H40011 Open angle with borderline findings, low risk, right eye: Secondary | ICD-10-CM | POA: Diagnosis not present

## 2015-10-10 DIAGNOSIS — H401124 Primary open-angle glaucoma, left eye, indeterminate stage: Secondary | ICD-10-CM | POA: Diagnosis not present

## 2015-10-10 DIAGNOSIS — Z961 Presence of intraocular lens: Secondary | ICD-10-CM | POA: Diagnosis not present

## 2015-11-29 DIAGNOSIS — M79671 Pain in right foot: Secondary | ICD-10-CM | POA: Diagnosis not present

## 2015-11-29 DIAGNOSIS — M25571 Pain in right ankle and joints of right foot: Secondary | ICD-10-CM | POA: Diagnosis not present

## 2015-12-22 DIAGNOSIS — M25571 Pain in right ankle and joints of right foot: Secondary | ICD-10-CM | POA: Diagnosis not present

## 2015-12-22 DIAGNOSIS — M79671 Pain in right foot: Secondary | ICD-10-CM | POA: Diagnosis not present

## 2016-02-15 DIAGNOSIS — N400 Enlarged prostate without lower urinary tract symptoms: Secondary | ICD-10-CM | POA: Diagnosis not present

## 2016-02-15 DIAGNOSIS — Z131 Encounter for screening for diabetes mellitus: Secondary | ICD-10-CM | POA: Diagnosis not present

## 2016-02-15 DIAGNOSIS — Z0000 Encounter for general adult medical examination without abnormal findings: Secondary | ICD-10-CM | POA: Diagnosis not present

## 2016-02-15 DIAGNOSIS — M259 Joint disorder, unspecified: Secondary | ICD-10-CM | POA: Diagnosis not present

## 2016-02-15 DIAGNOSIS — E7800 Pure hypercholesterolemia, unspecified: Secondary | ICD-10-CM | POA: Diagnosis not present

## 2016-02-15 DIAGNOSIS — I779 Disorder of arteries and arterioles, unspecified: Secondary | ICD-10-CM | POA: Diagnosis not present

## 2016-02-15 DIAGNOSIS — M1A00X1 Idiopathic chronic gout, unspecified site, with tophus (tophi): Secondary | ICD-10-CM | POA: Diagnosis not present

## 2016-02-15 DIAGNOSIS — Z125 Encounter for screening for malignant neoplasm of prostate: Secondary | ICD-10-CM | POA: Diagnosis not present

## 2016-03-14 DIAGNOSIS — H40011 Open angle with borderline findings, low risk, right eye: Secondary | ICD-10-CM | POA: Diagnosis not present

## 2016-04-25 DIAGNOSIS — Z23 Encounter for immunization: Secondary | ICD-10-CM | POA: Diagnosis not present

## 2016-05-07 DIAGNOSIS — J040 Acute laryngitis: Secondary | ICD-10-CM | POA: Diagnosis not present

## 2016-05-21 DIAGNOSIS — H401124 Primary open-angle glaucoma, left eye, indeterminate stage: Secondary | ICD-10-CM | POA: Diagnosis not present

## 2016-05-21 DIAGNOSIS — H40011 Open angle with borderline findings, low risk, right eye: Secondary | ICD-10-CM | POA: Diagnosis not present

## 2016-05-22 DIAGNOSIS — R05 Cough: Secondary | ICD-10-CM | POA: Diagnosis not present

## 2016-05-22 DIAGNOSIS — L84 Corns and callosities: Secondary | ICD-10-CM | POA: Diagnosis not present

## 2016-07-24 ENCOUNTER — Other Ambulatory Visit: Payer: Self-pay | Admitting: Family Medicine

## 2016-07-24 DIAGNOSIS — I6521 Occlusion and stenosis of right carotid artery: Secondary | ICD-10-CM | POA: Diagnosis not present

## 2016-07-24 DIAGNOSIS — R0989 Other specified symptoms and signs involving the circulatory and respiratory systems: Secondary | ICD-10-CM | POA: Diagnosis not present

## 2016-07-25 ENCOUNTER — Ambulatory Visit
Admission: RE | Admit: 2016-07-25 | Discharge: 2016-07-25 | Disposition: A | Discharge status: Home / Self Care | Payer: Medicare Other | Source: Ambulatory Visit | Attending: Family Medicine | Admitting: Family Medicine

## 2016-07-25 ENCOUNTER — Telehealth: Payer: Self-pay

## 2016-07-25 DIAGNOSIS — I6523 Occlusion and stenosis of bilateral carotid arteries: Secondary | ICD-10-CM | POA: Diagnosis not present

## 2016-07-25 DIAGNOSIS — R0989 Other specified symptoms and signs involving the circulatory and respiratory systems: Secondary | ICD-10-CM

## 2016-07-25 NOTE — Telephone Encounter (Signed)
Sent notes to scheduling 

## 2016-10-29 ENCOUNTER — Ambulatory Visit: Payer: Medicare Other | Admitting: Cardiology

## 2016-10-31 DIAGNOSIS — E7800 Pure hypercholesterolemia, unspecified: Secondary | ICD-10-CM | POA: Diagnosis not present

## 2016-10-31 DIAGNOSIS — I6521 Occlusion and stenosis of right carotid artery: Secondary | ICD-10-CM | POA: Diagnosis not present

## 2016-10-31 DIAGNOSIS — Z961 Presence of intraocular lens: Secondary | ICD-10-CM | POA: Diagnosis not present

## 2016-10-31 DIAGNOSIS — H2512 Age-related nuclear cataract, left eye: Secondary | ICD-10-CM | POA: Diagnosis not present

## 2016-10-31 DIAGNOSIS — H40011 Open angle with borderline findings, low risk, right eye: Secondary | ICD-10-CM | POA: Diagnosis not present

## 2016-10-31 DIAGNOSIS — H401124 Primary open-angle glaucoma, left eye, indeterminate stage: Secondary | ICD-10-CM | POA: Diagnosis not present

## 2016-11-12 DIAGNOSIS — E7800 Pure hypercholesterolemia, unspecified: Secondary | ICD-10-CM | POA: Diagnosis not present

## 2016-11-12 DIAGNOSIS — I6521 Occlusion and stenosis of right carotid artery: Secondary | ICD-10-CM | POA: Diagnosis not present

## 2016-12-24 DIAGNOSIS — H40011 Open angle with borderline findings, low risk, right eye: Secondary | ICD-10-CM | POA: Diagnosis not present

## 2016-12-24 DIAGNOSIS — H401123 Primary open-angle glaucoma, left eye, severe stage: Secondary | ICD-10-CM | POA: Diagnosis not present

## 2016-12-25 DIAGNOSIS — Z86010 Personal history of colonic polyps: Secondary | ICD-10-CM | POA: Diagnosis not present

## 2017-01-01 DIAGNOSIS — R21 Rash and other nonspecific skin eruption: Secondary | ICD-10-CM | POA: Diagnosis not present

## 2017-01-01 DIAGNOSIS — R109 Unspecified abdominal pain: Secondary | ICD-10-CM | POA: Diagnosis not present

## 2017-01-10 DIAGNOSIS — M25552 Pain in left hip: Secondary | ICD-10-CM | POA: Diagnosis not present

## 2017-01-18 DIAGNOSIS — K4090 Unilateral inguinal hernia, without obstruction or gangrene, not specified as recurrent: Secondary | ICD-10-CM | POA: Diagnosis not present

## 2017-01-22 DIAGNOSIS — K4090 Unilateral inguinal hernia, without obstruction or gangrene, not specified as recurrent: Secondary | ICD-10-CM | POA: Diagnosis not present

## 2017-01-29 DIAGNOSIS — K4090 Unilateral inguinal hernia, without obstruction or gangrene, not specified as recurrent: Secondary | ICD-10-CM | POA: Diagnosis not present

## 2017-03-01 DIAGNOSIS — Z125 Encounter for screening for malignant neoplasm of prostate: Secondary | ICD-10-CM | POA: Diagnosis not present

## 2017-03-01 DIAGNOSIS — M109 Gout, unspecified: Secondary | ICD-10-CM | POA: Diagnosis not present

## 2017-03-01 DIAGNOSIS — E7800 Pure hypercholesterolemia, unspecified: Secondary | ICD-10-CM | POA: Diagnosis not present

## 2017-03-01 DIAGNOSIS — Z79899 Other long term (current) drug therapy: Secondary | ICD-10-CM | POA: Diagnosis not present

## 2017-03-06 DIAGNOSIS — Z125 Encounter for screening for malignant neoplasm of prostate: Secondary | ICD-10-CM | POA: Diagnosis not present

## 2017-03-06 DIAGNOSIS — Z0000 Encounter for general adult medical examination without abnormal findings: Secondary | ICD-10-CM | POA: Diagnosis not present

## 2017-03-06 DIAGNOSIS — M109 Gout, unspecified: Secondary | ICD-10-CM | POA: Diagnosis not present

## 2017-03-06 DIAGNOSIS — E7800 Pure hypercholesterolemia, unspecified: Secondary | ICD-10-CM | POA: Diagnosis not present

## 2017-03-06 DIAGNOSIS — Z79899 Other long term (current) drug therapy: Secondary | ICD-10-CM | POA: Diagnosis not present

## 2017-03-07 DIAGNOSIS — M12542 Traumatic arthropathy, left hand: Secondary | ICD-10-CM | POA: Diagnosis not present

## 2017-03-11 DIAGNOSIS — Z85828 Personal history of other malignant neoplasm of skin: Secondary | ICD-10-CM | POA: Diagnosis not present

## 2017-03-11 DIAGNOSIS — L570 Actinic keratosis: Secondary | ICD-10-CM | POA: Diagnosis not present

## 2017-03-11 DIAGNOSIS — D692 Other nonthrombocytopenic purpura: Secondary | ICD-10-CM | POA: Diagnosis not present

## 2017-03-11 DIAGNOSIS — L821 Other seborrheic keratosis: Secondary | ICD-10-CM | POA: Diagnosis not present

## 2017-03-20 DIAGNOSIS — I6521 Occlusion and stenosis of right carotid artery: Secondary | ICD-10-CM | POA: Diagnosis not present

## 2017-03-20 DIAGNOSIS — E7800 Pure hypercholesterolemia, unspecified: Secondary | ICD-10-CM | POA: Diagnosis not present

## 2017-04-08 DIAGNOSIS — H269 Unspecified cataract: Secondary | ICD-10-CM | POA: Diagnosis not present

## 2017-04-08 DIAGNOSIS — H401123 Primary open-angle glaucoma, left eye, severe stage: Secondary | ICD-10-CM | POA: Diagnosis not present

## 2017-04-08 DIAGNOSIS — Z961 Presence of intraocular lens: Secondary | ICD-10-CM | POA: Diagnosis not present

## 2017-04-08 DIAGNOSIS — H40021 Open angle with borderline findings, high risk, right eye: Secondary | ICD-10-CM | POA: Diagnosis not present

## 2017-04-17 DIAGNOSIS — Z23 Encounter for immunization: Secondary | ICD-10-CM | POA: Diagnosis not present

## 2017-05-01 DIAGNOSIS — I6523 Occlusion and stenosis of bilateral carotid arteries: Secondary | ICD-10-CM | POA: Diagnosis not present

## 2017-05-07 DIAGNOSIS — M5136 Other intervertebral disc degeneration, lumbar region: Secondary | ICD-10-CM | POA: Diagnosis not present

## 2017-05-07 DIAGNOSIS — M545 Low back pain: Secondary | ICD-10-CM | POA: Diagnosis not present

## 2017-05-20 DIAGNOSIS — H401111 Primary open-angle glaucoma, right eye, mild stage: Secondary | ICD-10-CM | POA: Diagnosis not present

## 2017-05-20 DIAGNOSIS — H401124 Primary open-angle glaucoma, left eye, indeterminate stage: Secondary | ICD-10-CM | POA: Diagnosis not present

## 2017-05-30 DIAGNOSIS — R1011 Right upper quadrant pain: Secondary | ICD-10-CM | POA: Diagnosis not present

## 2017-05-30 DIAGNOSIS — R8290 Unspecified abnormal findings in urine: Secondary | ICD-10-CM | POA: Diagnosis not present

## 2017-05-31 ENCOUNTER — Other Ambulatory Visit: Payer: Self-pay | Admitting: Family Medicine

## 2017-05-31 DIAGNOSIS — R1011 Right upper quadrant pain: Secondary | ICD-10-CM

## 2017-06-05 ENCOUNTER — Ambulatory Visit
Admission: RE | Admit: 2017-06-05 | Discharge: 2017-06-05 | Disposition: A | Discharge status: Home / Self Care | Payer: Medicare Other | Source: Ambulatory Visit | Attending: Family Medicine | Admitting: Family Medicine

## 2017-06-05 DIAGNOSIS — R1011 Right upper quadrant pain: Secondary | ICD-10-CM

## 2017-06-11 ENCOUNTER — Other Ambulatory Visit: Payer: Self-pay | Admitting: Gastroenterology

## 2017-06-11 DIAGNOSIS — R933 Abnormal findings on diagnostic imaging of other parts of digestive tract: Secondary | ICD-10-CM

## 2017-06-13 ENCOUNTER — Ambulatory Visit
Admission: RE | Admit: 2017-06-13 | Discharge: 2017-06-13 | Disposition: A | Discharge status: Home / Self Care | Payer: Medicare Other | Source: Ambulatory Visit | Attending: Gastroenterology | Admitting: Gastroenterology

## 2017-06-13 DIAGNOSIS — R1011 Right upper quadrant pain: Secondary | ICD-10-CM | POA: Diagnosis not present

## 2017-06-13 DIAGNOSIS — R932 Abnormal findings on diagnostic imaging of liver and biliary tract: Secondary | ICD-10-CM | POA: Diagnosis not present

## 2017-06-13 DIAGNOSIS — R933 Abnormal findings on diagnostic imaging of other parts of digestive tract: Secondary | ICD-10-CM

## 2017-06-13 MED ORDER — GADOBENATE DIMEGLUMINE 529 MG/ML IV SOLN
15.0000 mL | Freq: Once | INTRAVENOUS | Status: AC | PRN
  Administered 2017-06-13: 15 mL via INTRAVENOUS

## 2017-06-17 ENCOUNTER — Other Ambulatory Visit: Payer: Medicare Other

## 2017-06-17 ENCOUNTER — Other Ambulatory Visit: Payer: Self-pay | Admitting: Gastroenterology

## 2017-06-17 DIAGNOSIS — K869 Disease of pancreas, unspecified: Secondary | ICD-10-CM | POA: Diagnosis not present

## 2017-06-18 ENCOUNTER — Encounter (HOSPITAL_COMMUNITY): Payer: Self-pay | Admitting: *Deleted

## 2017-06-18 ENCOUNTER — Other Ambulatory Visit: Payer: Self-pay | Admitting: Gastroenterology

## 2017-06-18 DIAGNOSIS — J301 Allergic rhinitis due to pollen: Secondary | ICD-10-CM | POA: Diagnosis not present

## 2017-06-18 DIAGNOSIS — C772 Secondary and unspecified malignant neoplasm of intra-abdominal lymph nodes: Secondary | ICD-10-CM | POA: Diagnosis not present

## 2017-06-18 DIAGNOSIS — K219 Gastro-esophageal reflux disease without esophagitis: Secondary | ICD-10-CM | POA: Diagnosis not present

## 2017-06-18 DIAGNOSIS — M109 Gout, unspecified: Secondary | ICD-10-CM | POA: Diagnosis not present

## 2017-06-18 DIAGNOSIS — C259 Malignant neoplasm of pancreas, unspecified: Secondary | ICD-10-CM | POA: Diagnosis not present

## 2017-06-18 DIAGNOSIS — R634 Abnormal weight loss: Secondary | ICD-10-CM | POA: Diagnosis not present

## 2017-06-19 ENCOUNTER — Ambulatory Visit (HOSPITAL_COMMUNITY): Admission: RE | Admit: 2017-06-19 | Payer: Medicare Other | Source: Ambulatory Visit | Admitting: Gastroenterology

## 2017-06-19 DIAGNOSIS — M109 Gout, unspecified: Secondary | ICD-10-CM | POA: Diagnosis not present

## 2017-06-19 DIAGNOSIS — R634 Abnormal weight loss: Secondary | ICD-10-CM | POA: Diagnosis not present

## 2017-06-19 DIAGNOSIS — K219 Gastro-esophageal reflux disease without esophagitis: Secondary | ICD-10-CM | POA: Diagnosis not present

## 2017-06-19 DIAGNOSIS — C259 Malignant neoplasm of pancreas, unspecified: Secondary | ICD-10-CM | POA: Diagnosis not present

## 2017-06-19 DIAGNOSIS — C772 Secondary and unspecified malignant neoplasm of intra-abdominal lymph nodes: Secondary | ICD-10-CM | POA: Diagnosis not present

## 2017-06-19 DIAGNOSIS — J301 Allergic rhinitis due to pollen: Secondary | ICD-10-CM | POA: Diagnosis not present

## 2017-06-19 SURGERY — ESOPHAGEAL ENDOSCOPIC ULTRASOUND (EUS) RADIAL
Anesthesia: Monitor Anesthesia Care

## 2017-06-20 DIAGNOSIS — C259 Malignant neoplasm of pancreas, unspecified: Secondary | ICD-10-CM | POA: Diagnosis not present

## 2017-06-20 DIAGNOSIS — K219 Gastro-esophageal reflux disease without esophagitis: Secondary | ICD-10-CM | POA: Diagnosis not present

## 2017-06-20 DIAGNOSIS — J301 Allergic rhinitis due to pollen: Secondary | ICD-10-CM | POA: Diagnosis not present

## 2017-06-20 DIAGNOSIS — R634 Abnormal weight loss: Secondary | ICD-10-CM | POA: Diagnosis not present

## 2017-06-20 DIAGNOSIS — M109 Gout, unspecified: Secondary | ICD-10-CM | POA: Diagnosis not present

## 2017-06-20 DIAGNOSIS — C772 Secondary and unspecified malignant neoplasm of intra-abdominal lymph nodes: Secondary | ICD-10-CM | POA: Diagnosis not present

## 2017-06-21 DIAGNOSIS — J301 Allergic rhinitis due to pollen: Secondary | ICD-10-CM | POA: Diagnosis not present

## 2017-06-21 DIAGNOSIS — C772 Secondary and unspecified malignant neoplasm of intra-abdominal lymph nodes: Secondary | ICD-10-CM | POA: Diagnosis not present

## 2017-06-21 DIAGNOSIS — K219 Gastro-esophageal reflux disease without esophagitis: Secondary | ICD-10-CM | POA: Diagnosis not present

## 2017-06-21 DIAGNOSIS — M109 Gout, unspecified: Secondary | ICD-10-CM | POA: Diagnosis not present

## 2017-06-21 DIAGNOSIS — R634 Abnormal weight loss: Secondary | ICD-10-CM | POA: Diagnosis not present

## 2017-06-21 DIAGNOSIS — C259 Malignant neoplasm of pancreas, unspecified: Secondary | ICD-10-CM | POA: Diagnosis not present

## 2017-06-22 DIAGNOSIS — M109 Gout, unspecified: Secondary | ICD-10-CM | POA: Diagnosis not present

## 2017-06-22 DIAGNOSIS — C772 Secondary and unspecified malignant neoplasm of intra-abdominal lymph nodes: Secondary | ICD-10-CM | POA: Diagnosis not present

## 2017-06-22 DIAGNOSIS — R634 Abnormal weight loss: Secondary | ICD-10-CM | POA: Diagnosis not present

## 2017-06-22 DIAGNOSIS — J301 Allergic rhinitis due to pollen: Secondary | ICD-10-CM | POA: Diagnosis not present

## 2017-06-22 DIAGNOSIS — C259 Malignant neoplasm of pancreas, unspecified: Secondary | ICD-10-CM | POA: Diagnosis not present

## 2017-06-22 DIAGNOSIS — K219 Gastro-esophageal reflux disease without esophagitis: Secondary | ICD-10-CM | POA: Diagnosis not present

## 2017-06-23 DIAGNOSIS — M109 Gout, unspecified: Secondary | ICD-10-CM | POA: Diagnosis not present

## 2017-06-23 DIAGNOSIS — J301 Allergic rhinitis due to pollen: Secondary | ICD-10-CM | POA: Diagnosis not present

## 2017-06-23 DIAGNOSIS — R634 Abnormal weight loss: Secondary | ICD-10-CM | POA: Diagnosis not present

## 2017-06-23 DIAGNOSIS — K219 Gastro-esophageal reflux disease without esophagitis: Secondary | ICD-10-CM | POA: Diagnosis not present

## 2017-06-23 DIAGNOSIS — C772 Secondary and unspecified malignant neoplasm of intra-abdominal lymph nodes: Secondary | ICD-10-CM | POA: Diagnosis not present

## 2017-06-23 DIAGNOSIS — C259 Malignant neoplasm of pancreas, unspecified: Secondary | ICD-10-CM | POA: Diagnosis not present

## 2017-06-24 DIAGNOSIS — J301 Allergic rhinitis due to pollen: Secondary | ICD-10-CM | POA: Diagnosis not present

## 2017-06-24 DIAGNOSIS — C772 Secondary and unspecified malignant neoplasm of intra-abdominal lymph nodes: Secondary | ICD-10-CM | POA: Diagnosis not present

## 2017-06-24 DIAGNOSIS — C259 Malignant neoplasm of pancreas, unspecified: Secondary | ICD-10-CM | POA: Diagnosis not present

## 2017-06-24 DIAGNOSIS — R634 Abnormal weight loss: Secondary | ICD-10-CM | POA: Diagnosis not present

## 2017-06-24 DIAGNOSIS — M109 Gout, unspecified: Secondary | ICD-10-CM | POA: Diagnosis not present

## 2017-06-24 DIAGNOSIS — K219 Gastro-esophageal reflux disease without esophagitis: Secondary | ICD-10-CM | POA: Diagnosis not present

## 2017-06-25 DIAGNOSIS — M109 Gout, unspecified: Secondary | ICD-10-CM | POA: Diagnosis not present

## 2017-06-25 DIAGNOSIS — R634 Abnormal weight loss: Secondary | ICD-10-CM | POA: Diagnosis not present

## 2017-06-25 DIAGNOSIS — J301 Allergic rhinitis due to pollen: Secondary | ICD-10-CM | POA: Diagnosis not present

## 2017-06-25 DIAGNOSIS — C259 Malignant neoplasm of pancreas, unspecified: Secondary | ICD-10-CM | POA: Diagnosis not present

## 2017-06-25 DIAGNOSIS — C772 Secondary and unspecified malignant neoplasm of intra-abdominal lymph nodes: Secondary | ICD-10-CM | POA: Diagnosis not present

## 2017-06-25 DIAGNOSIS — K219 Gastro-esophageal reflux disease without esophagitis: Secondary | ICD-10-CM | POA: Diagnosis not present

## 2017-06-26 DIAGNOSIS — J301 Allergic rhinitis due to pollen: Secondary | ICD-10-CM | POA: Diagnosis not present

## 2017-06-26 DIAGNOSIS — M109 Gout, unspecified: Secondary | ICD-10-CM | POA: Diagnosis not present

## 2017-06-26 DIAGNOSIS — K219 Gastro-esophageal reflux disease without esophagitis: Secondary | ICD-10-CM | POA: Diagnosis not present

## 2017-06-26 DIAGNOSIS — R634 Abnormal weight loss: Secondary | ICD-10-CM | POA: Diagnosis not present

## 2017-06-26 DIAGNOSIS — C772 Secondary and unspecified malignant neoplasm of intra-abdominal lymph nodes: Secondary | ICD-10-CM | POA: Diagnosis not present

## 2017-06-26 DIAGNOSIS — C259 Malignant neoplasm of pancreas, unspecified: Secondary | ICD-10-CM | POA: Diagnosis not present

## 2017-06-27 DIAGNOSIS — K219 Gastro-esophageal reflux disease without esophagitis: Secondary | ICD-10-CM | POA: Diagnosis not present

## 2017-06-27 DIAGNOSIS — R634 Abnormal weight loss: Secondary | ICD-10-CM | POA: Diagnosis not present

## 2017-06-27 DIAGNOSIS — J301 Allergic rhinitis due to pollen: Secondary | ICD-10-CM | POA: Diagnosis not present

## 2017-06-27 DIAGNOSIS — C259 Malignant neoplasm of pancreas, unspecified: Secondary | ICD-10-CM | POA: Diagnosis not present

## 2017-06-27 DIAGNOSIS — C772 Secondary and unspecified malignant neoplasm of intra-abdominal lymph nodes: Secondary | ICD-10-CM | POA: Diagnosis not present

## 2017-06-27 DIAGNOSIS — M109 Gout, unspecified: Secondary | ICD-10-CM | POA: Diagnosis not present

## 2017-06-28 DIAGNOSIS — M109 Gout, unspecified: Secondary | ICD-10-CM | POA: Diagnosis not present

## 2017-06-28 DIAGNOSIS — C259 Malignant neoplasm of pancreas, unspecified: Secondary | ICD-10-CM | POA: Diagnosis not present

## 2017-06-28 DIAGNOSIS — J301 Allergic rhinitis due to pollen: Secondary | ICD-10-CM | POA: Diagnosis not present

## 2017-06-28 DIAGNOSIS — C772 Secondary and unspecified malignant neoplasm of intra-abdominal lymph nodes: Secondary | ICD-10-CM | POA: Diagnosis not present

## 2017-06-28 DIAGNOSIS — R634 Abnormal weight loss: Secondary | ICD-10-CM | POA: Diagnosis not present

## 2017-06-28 DIAGNOSIS — K219 Gastro-esophageal reflux disease without esophagitis: Secondary | ICD-10-CM | POA: Diagnosis not present

## 2017-07-01 DIAGNOSIS — C259 Malignant neoplasm of pancreas, unspecified: Secondary | ICD-10-CM | POA: Diagnosis not present

## 2017-07-01 DIAGNOSIS — K219 Gastro-esophageal reflux disease without esophagitis: Secondary | ICD-10-CM | POA: Diagnosis not present

## 2017-07-01 DIAGNOSIS — M109 Gout, unspecified: Secondary | ICD-10-CM | POA: Diagnosis not present

## 2017-07-01 DIAGNOSIS — C772 Secondary and unspecified malignant neoplasm of intra-abdominal lymph nodes: Secondary | ICD-10-CM | POA: Diagnosis not present

## 2017-07-01 DIAGNOSIS — R634 Abnormal weight loss: Secondary | ICD-10-CM | POA: Diagnosis not present

## 2017-07-01 DIAGNOSIS — J301 Allergic rhinitis due to pollen: Secondary | ICD-10-CM | POA: Diagnosis not present

## 2017-07-02 DIAGNOSIS — C772 Secondary and unspecified malignant neoplasm of intra-abdominal lymph nodes: Secondary | ICD-10-CM | POA: Diagnosis not present

## 2017-07-02 DIAGNOSIS — R634 Abnormal weight loss: Secondary | ICD-10-CM | POA: Diagnosis not present

## 2017-07-02 DIAGNOSIS — C259 Malignant neoplasm of pancreas, unspecified: Secondary | ICD-10-CM | POA: Diagnosis not present

## 2017-07-02 DIAGNOSIS — J301 Allergic rhinitis due to pollen: Secondary | ICD-10-CM | POA: Diagnosis not present

## 2017-07-02 DIAGNOSIS — K219 Gastro-esophageal reflux disease without esophagitis: Secondary | ICD-10-CM | POA: Diagnosis not present

## 2017-07-02 DIAGNOSIS — M109 Gout, unspecified: Secondary | ICD-10-CM | POA: Diagnosis not present

## 2017-07-04 DIAGNOSIS — M109 Gout, unspecified: Secondary | ICD-10-CM | POA: Diagnosis not present

## 2017-07-04 DIAGNOSIS — C772 Secondary and unspecified malignant neoplasm of intra-abdominal lymph nodes: Secondary | ICD-10-CM | POA: Diagnosis not present

## 2017-07-04 DIAGNOSIS — R634 Abnormal weight loss: Secondary | ICD-10-CM | POA: Diagnosis not present

## 2017-07-04 DIAGNOSIS — J301 Allergic rhinitis due to pollen: Secondary | ICD-10-CM | POA: Diagnosis not present

## 2017-07-04 DIAGNOSIS — C259 Malignant neoplasm of pancreas, unspecified: Secondary | ICD-10-CM | POA: Diagnosis not present

## 2017-07-04 DIAGNOSIS — K219 Gastro-esophageal reflux disease without esophagitis: Secondary | ICD-10-CM | POA: Diagnosis not present

## 2017-07-05 DIAGNOSIS — C259 Malignant neoplasm of pancreas, unspecified: Secondary | ICD-10-CM | POA: Diagnosis not present

## 2017-07-05 DIAGNOSIS — M109 Gout, unspecified: Secondary | ICD-10-CM | POA: Diagnosis not present

## 2017-07-05 DIAGNOSIS — K219 Gastro-esophageal reflux disease without esophagitis: Secondary | ICD-10-CM | POA: Diagnosis not present

## 2017-07-05 DIAGNOSIS — C772 Secondary and unspecified malignant neoplasm of intra-abdominal lymph nodes: Secondary | ICD-10-CM | POA: Diagnosis not present

## 2017-07-05 DIAGNOSIS — J301 Allergic rhinitis due to pollen: Secondary | ICD-10-CM | POA: Diagnosis not present

## 2017-07-05 DIAGNOSIS — R634 Abnormal weight loss: Secondary | ICD-10-CM | POA: Diagnosis not present

## 2017-07-06 DIAGNOSIS — C772 Secondary and unspecified malignant neoplasm of intra-abdominal lymph nodes: Secondary | ICD-10-CM | POA: Diagnosis not present

## 2017-07-06 DIAGNOSIS — C259 Malignant neoplasm of pancreas, unspecified: Secondary | ICD-10-CM | POA: Diagnosis not present

## 2017-07-06 DIAGNOSIS — K219 Gastro-esophageal reflux disease without esophagitis: Secondary | ICD-10-CM | POA: Diagnosis not present

## 2017-07-06 DIAGNOSIS — R634 Abnormal weight loss: Secondary | ICD-10-CM | POA: Diagnosis not present

## 2017-07-06 DIAGNOSIS — J301 Allergic rhinitis due to pollen: Secondary | ICD-10-CM | POA: Diagnosis not present

## 2017-07-06 DIAGNOSIS — M109 Gout, unspecified: Secondary | ICD-10-CM | POA: Diagnosis not present

## 2017-07-07 DIAGNOSIS — K219 Gastro-esophageal reflux disease without esophagitis: Secondary | ICD-10-CM | POA: Diagnosis not present

## 2017-07-07 DIAGNOSIS — J301 Allergic rhinitis due to pollen: Secondary | ICD-10-CM | POA: Diagnosis not present

## 2017-07-07 DIAGNOSIS — C259 Malignant neoplasm of pancreas, unspecified: Secondary | ICD-10-CM | POA: Diagnosis not present

## 2017-07-07 DIAGNOSIS — M109 Gout, unspecified: Secondary | ICD-10-CM | POA: Diagnosis not present

## 2017-07-07 DIAGNOSIS — C772 Secondary and unspecified malignant neoplasm of intra-abdominal lymph nodes: Secondary | ICD-10-CM | POA: Diagnosis not present

## 2017-07-07 DIAGNOSIS — R634 Abnormal weight loss: Secondary | ICD-10-CM | POA: Diagnosis not present

## 2017-07-08 DIAGNOSIS — R634 Abnormal weight loss: Secondary | ICD-10-CM | POA: Diagnosis not present

## 2017-07-08 DIAGNOSIS — C259 Malignant neoplasm of pancreas, unspecified: Secondary | ICD-10-CM | POA: Diagnosis not present

## 2017-07-08 DIAGNOSIS — J301 Allergic rhinitis due to pollen: Secondary | ICD-10-CM | POA: Diagnosis not present

## 2017-07-08 DIAGNOSIS — M109 Gout, unspecified: Secondary | ICD-10-CM | POA: Diagnosis not present

## 2017-07-08 DIAGNOSIS — K219 Gastro-esophageal reflux disease without esophagitis: Secondary | ICD-10-CM | POA: Diagnosis not present

## 2017-07-08 DIAGNOSIS — C772 Secondary and unspecified malignant neoplasm of intra-abdominal lymph nodes: Secondary | ICD-10-CM | POA: Diagnosis not present

## 2017-07-09 DIAGNOSIS — C772 Secondary and unspecified malignant neoplasm of intra-abdominal lymph nodes: Secondary | ICD-10-CM | POA: Diagnosis not present

## 2017-07-09 DIAGNOSIS — C259 Malignant neoplasm of pancreas, unspecified: Secondary | ICD-10-CM | POA: Diagnosis not present

## 2017-07-09 DIAGNOSIS — K219 Gastro-esophageal reflux disease without esophagitis: Secondary | ICD-10-CM | POA: Diagnosis not present

## 2017-07-09 DIAGNOSIS — M109 Gout, unspecified: Secondary | ICD-10-CM | POA: Diagnosis not present

## 2017-07-09 DIAGNOSIS — J301 Allergic rhinitis due to pollen: Secondary | ICD-10-CM | POA: Diagnosis not present

## 2017-07-09 DIAGNOSIS — R634 Abnormal weight loss: Secondary | ICD-10-CM | POA: Diagnosis not present

## 2017-07-10 DIAGNOSIS — C772 Secondary and unspecified malignant neoplasm of intra-abdominal lymph nodes: Secondary | ICD-10-CM | POA: Diagnosis not present

## 2017-07-10 DIAGNOSIS — C259 Malignant neoplasm of pancreas, unspecified: Secondary | ICD-10-CM | POA: Diagnosis not present

## 2017-07-10 DIAGNOSIS — M109 Gout, unspecified: Secondary | ICD-10-CM | POA: Diagnosis not present

## 2017-07-10 DIAGNOSIS — R634 Abnormal weight loss: Secondary | ICD-10-CM | POA: Diagnosis not present

## 2017-07-10 DIAGNOSIS — J301 Allergic rhinitis due to pollen: Secondary | ICD-10-CM | POA: Diagnosis not present

## 2017-07-10 DIAGNOSIS — K219 Gastro-esophageal reflux disease without esophagitis: Secondary | ICD-10-CM | POA: Diagnosis not present

## 2017-07-11 DIAGNOSIS — M109 Gout, unspecified: Secondary | ICD-10-CM | POA: Diagnosis not present

## 2017-07-11 DIAGNOSIS — R634 Abnormal weight loss: Secondary | ICD-10-CM | POA: Diagnosis not present

## 2017-07-11 DIAGNOSIS — C772 Secondary and unspecified malignant neoplasm of intra-abdominal lymph nodes: Secondary | ICD-10-CM | POA: Diagnosis not present

## 2017-07-11 DIAGNOSIS — K219 Gastro-esophageal reflux disease without esophagitis: Secondary | ICD-10-CM | POA: Diagnosis not present

## 2017-07-11 DIAGNOSIS — C259 Malignant neoplasm of pancreas, unspecified: Secondary | ICD-10-CM | POA: Diagnosis not present

## 2017-07-11 DIAGNOSIS — J301 Allergic rhinitis due to pollen: Secondary | ICD-10-CM | POA: Diagnosis not present

## 2017-07-12 DIAGNOSIS — C259 Malignant neoplasm of pancreas, unspecified: Secondary | ICD-10-CM | POA: Diagnosis not present

## 2017-07-12 DIAGNOSIS — J301 Allergic rhinitis due to pollen: Secondary | ICD-10-CM | POA: Diagnosis not present

## 2017-07-12 DIAGNOSIS — C772 Secondary and unspecified malignant neoplasm of intra-abdominal lymph nodes: Secondary | ICD-10-CM | POA: Diagnosis not present

## 2017-07-12 DIAGNOSIS — M109 Gout, unspecified: Secondary | ICD-10-CM | POA: Diagnosis not present

## 2017-07-12 DIAGNOSIS — R634 Abnormal weight loss: Secondary | ICD-10-CM | POA: Diagnosis not present

## 2017-07-12 DIAGNOSIS — K219 Gastro-esophageal reflux disease without esophagitis: Secondary | ICD-10-CM | POA: Diagnosis not present

## 2017-07-13 DIAGNOSIS — C772 Secondary and unspecified malignant neoplasm of intra-abdominal lymph nodes: Secondary | ICD-10-CM | POA: Diagnosis not present

## 2017-07-13 DIAGNOSIS — R634 Abnormal weight loss: Secondary | ICD-10-CM | POA: Diagnosis not present

## 2017-07-13 DIAGNOSIS — C259 Malignant neoplasm of pancreas, unspecified: Secondary | ICD-10-CM | POA: Diagnosis not present

## 2017-07-13 DIAGNOSIS — K219 Gastro-esophageal reflux disease without esophagitis: Secondary | ICD-10-CM | POA: Diagnosis not present

## 2017-07-13 DIAGNOSIS — M109 Gout, unspecified: Secondary | ICD-10-CM | POA: Diagnosis not present

## 2017-07-13 DIAGNOSIS — J301 Allergic rhinitis due to pollen: Secondary | ICD-10-CM | POA: Diagnosis not present

## 2017-07-14 DIAGNOSIS — J301 Allergic rhinitis due to pollen: Secondary | ICD-10-CM | POA: Diagnosis not present

## 2017-07-14 DIAGNOSIS — K219 Gastro-esophageal reflux disease without esophagitis: Secondary | ICD-10-CM | POA: Diagnosis not present

## 2017-07-14 DIAGNOSIS — M109 Gout, unspecified: Secondary | ICD-10-CM | POA: Diagnosis not present

## 2017-07-14 DIAGNOSIS — C772 Secondary and unspecified malignant neoplasm of intra-abdominal lymph nodes: Secondary | ICD-10-CM | POA: Diagnosis not present

## 2017-07-14 DIAGNOSIS — C259 Malignant neoplasm of pancreas, unspecified: Secondary | ICD-10-CM | POA: Diagnosis not present

## 2017-07-14 DIAGNOSIS — R634 Abnormal weight loss: Secondary | ICD-10-CM | POA: Diagnosis not present

## 2017-07-15 DIAGNOSIS — J301 Allergic rhinitis due to pollen: Secondary | ICD-10-CM | POA: Diagnosis not present

## 2017-07-15 DIAGNOSIS — M109 Gout, unspecified: Secondary | ICD-10-CM | POA: Diagnosis not present

## 2017-07-15 DIAGNOSIS — K219 Gastro-esophageal reflux disease without esophagitis: Secondary | ICD-10-CM | POA: Diagnosis not present

## 2017-07-15 DIAGNOSIS — C772 Secondary and unspecified malignant neoplasm of intra-abdominal lymph nodes: Secondary | ICD-10-CM | POA: Diagnosis not present

## 2017-07-15 DIAGNOSIS — C259 Malignant neoplasm of pancreas, unspecified: Secondary | ICD-10-CM | POA: Diagnosis not present

## 2017-07-15 DIAGNOSIS — R634 Abnormal weight loss: Secondary | ICD-10-CM | POA: Diagnosis not present

## 2017-07-16 DIAGNOSIS — J301 Allergic rhinitis due to pollen: Secondary | ICD-10-CM | POA: Diagnosis not present

## 2017-07-16 DIAGNOSIS — K219 Gastro-esophageal reflux disease without esophagitis: Secondary | ICD-10-CM | POA: Diagnosis not present

## 2017-07-16 DIAGNOSIS — C772 Secondary and unspecified malignant neoplasm of intra-abdominal lymph nodes: Secondary | ICD-10-CM | POA: Diagnosis not present

## 2017-07-16 DIAGNOSIS — C259 Malignant neoplasm of pancreas, unspecified: Secondary | ICD-10-CM | POA: Diagnosis not present

## 2017-07-16 DIAGNOSIS — R634 Abnormal weight loss: Secondary | ICD-10-CM | POA: Diagnosis not present

## 2017-07-16 DIAGNOSIS — M109 Gout, unspecified: Secondary | ICD-10-CM | POA: Diagnosis not present

## 2017-07-17 DIAGNOSIS — C259 Malignant neoplasm of pancreas, unspecified: Secondary | ICD-10-CM | POA: Diagnosis not present

## 2017-07-17 DIAGNOSIS — J301 Allergic rhinitis due to pollen: Secondary | ICD-10-CM | POA: Diagnosis not present

## 2017-07-17 DIAGNOSIS — K219 Gastro-esophageal reflux disease without esophagitis: Secondary | ICD-10-CM | POA: Diagnosis not present

## 2017-07-17 DIAGNOSIS — C772 Secondary and unspecified malignant neoplasm of intra-abdominal lymph nodes: Secondary | ICD-10-CM | POA: Diagnosis not present

## 2017-07-17 DIAGNOSIS — R634 Abnormal weight loss: Secondary | ICD-10-CM | POA: Diagnosis not present

## 2017-07-17 DIAGNOSIS — M109 Gout, unspecified: Secondary | ICD-10-CM | POA: Diagnosis not present

## 2017-07-18 DIAGNOSIS — C259 Malignant neoplasm of pancreas, unspecified: Secondary | ICD-10-CM | POA: Diagnosis not present

## 2017-07-18 DIAGNOSIS — K219 Gastro-esophageal reflux disease without esophagitis: Secondary | ICD-10-CM | POA: Diagnosis not present

## 2017-07-18 DIAGNOSIS — M109 Gout, unspecified: Secondary | ICD-10-CM | POA: Diagnosis not present

## 2017-07-18 DIAGNOSIS — R634 Abnormal weight loss: Secondary | ICD-10-CM | POA: Diagnosis not present

## 2017-07-18 DIAGNOSIS — J301 Allergic rhinitis due to pollen: Secondary | ICD-10-CM | POA: Diagnosis not present

## 2017-07-18 DIAGNOSIS — C772 Secondary and unspecified malignant neoplasm of intra-abdominal lymph nodes: Secondary | ICD-10-CM | POA: Diagnosis not present

## 2017-08-16 DEATH — deceased

## 2019-07-15 IMAGING — MR MR ABDOMEN WO/W CM MRCP
13 of 20 series · 29 of 48 positions shown · IV contrast (multihance)
Comparison: Ultrasound 06/05/2017.  Prior MRI of 08/05/2014.

CLINICAL DATA: Right upper quadrant pain for 3 weeks.

EXAM:
MRI ABDOMEN WITHOUT AND WITH CONTRAST (INCLUDING MRCP)
TECHNIQUE: Multiplanar multisequence MR imaging of the abdomen was performed
both before and after the administration of intravenous contrast.
Heavily T2-weighted images of the biliary and pancreatic ducts were
obtained, and three-dimensional MRCP images were rendered by post
processing.
CONTRAST:  15mL MULTIHANCE GADOBENATE DIMEGLUMINE 529 MG/ML IV SOLN

[Series 3: T2 · coronal · 5.0mm · 1.48mm/px · 2 of 33 slices shown (1 of 3)]
[im 1/33]
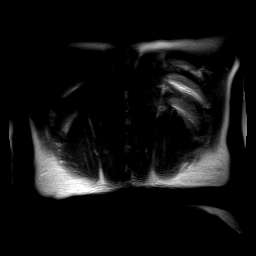
[im 33/33]
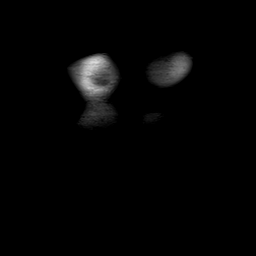

[Series 4: T2 · axial · 6.0mm · 1.48mm/px · 1 of 40 slices shown (2 of 3)]
[im 1/40]
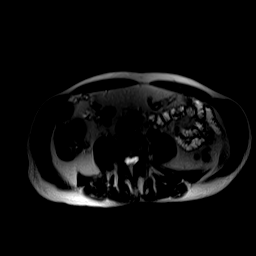

[Series 7: axial in out · axial · 6.0mm · 0.74mm/px · z∈[-133,+156]mm · 3 of 86 slices shown]
[im 1/86]
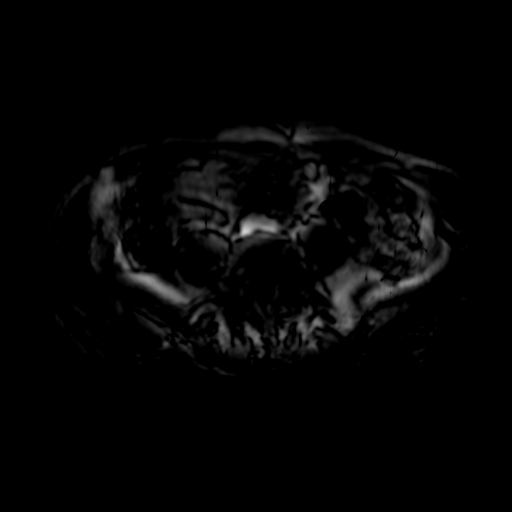
[im 43/86]
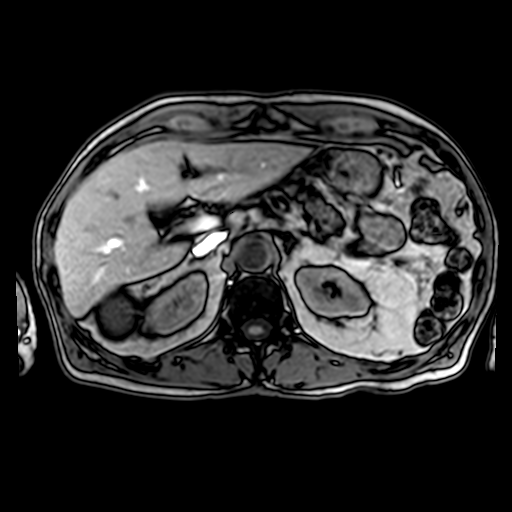
[im 86/86]
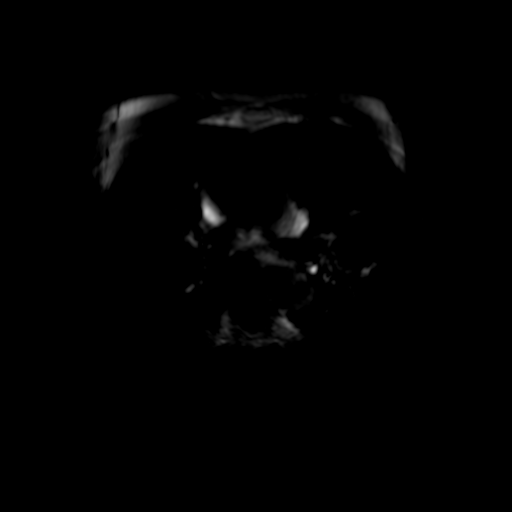

[Series 8: axial tru fisp · axial · 3.5mm · 1.48mm/px · z∈[-94,+163]mm · 2 of 65 slices shown]
[im 1/65]
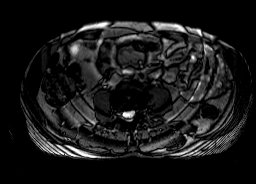
[im 65/65]
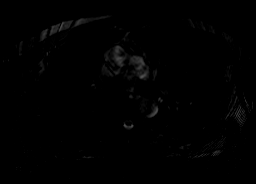

[Series 9: T2 · axial · 6.0mm · 0.74mm/px · 1 of 40 slices shown (3 of 3)]
[im 1/40]
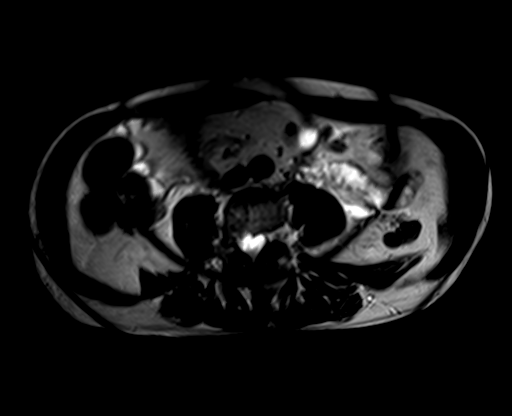

[Series 11: ep2d_diff_b50_500_800_p2 · axial · 6.0mm · 1.98mm/px · z∈[-109,+161]mm · 4 of 120 slices shown]
[im 1/120]
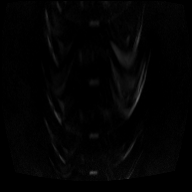
[im 40/120]
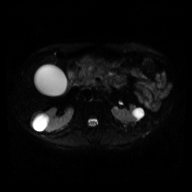
[im 80/120]
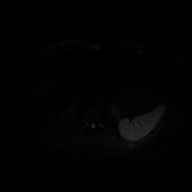
[im 120/120]
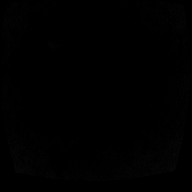

[Series 12: ep2d_diff_b50_500_800_p2_adc · axial · 6.0mm · 1.98mm/px · 1 of 40 slices shown]
[im 1/40]
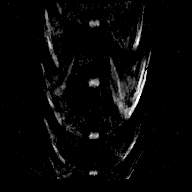

[Series 15: cor haste fs · coronal · 3.0mm · 0.70mm/px · 2 of 46 slices shown]
[im 1/46]
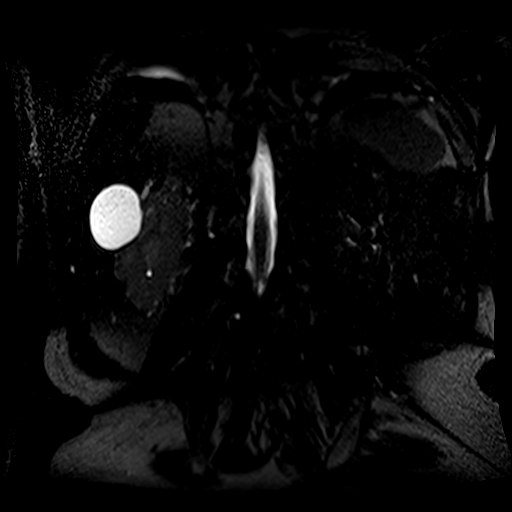
[im 46/46]
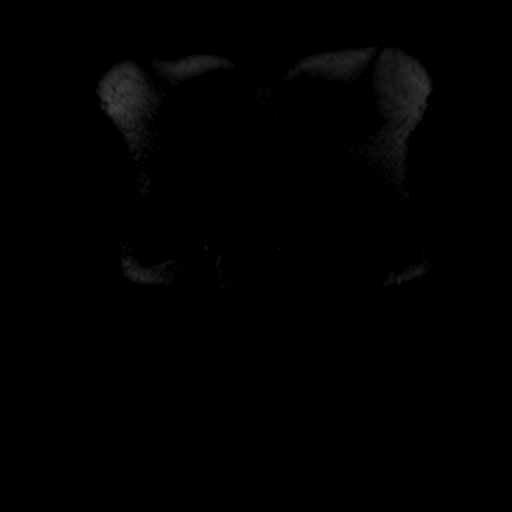

[Series 16: T1 dynamic · axial · non-contrast · 2.5mm · 0.74mm/px · z∈[-92,+146]mm · 3 of 96 slices shown]
[im 1/96]
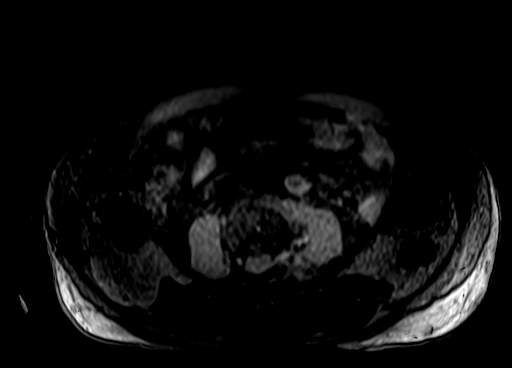
[im 48/96]
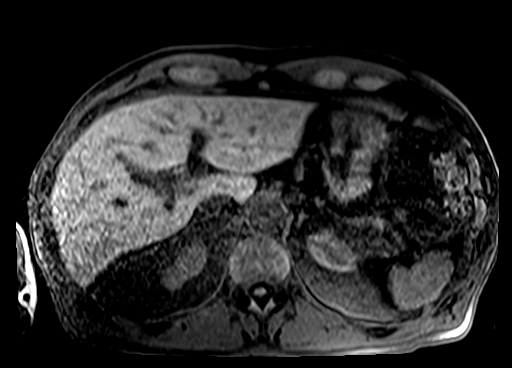
[im 96/96]
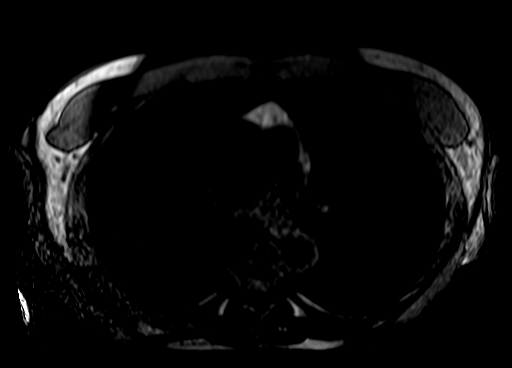

[Series 17: post 25 sec · axial · 2.5mm · 0.74mm/px · z∈[-92,+146]mm · 3 of 96 slices shown]
[im 1/96]
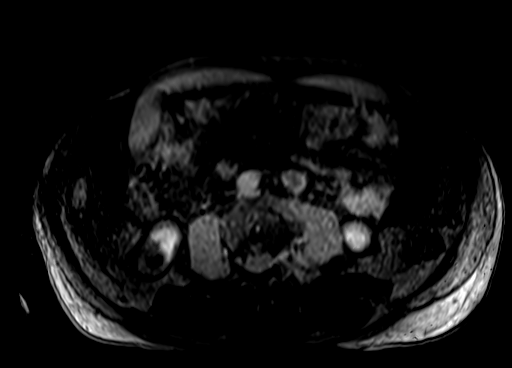
[im 48/96]
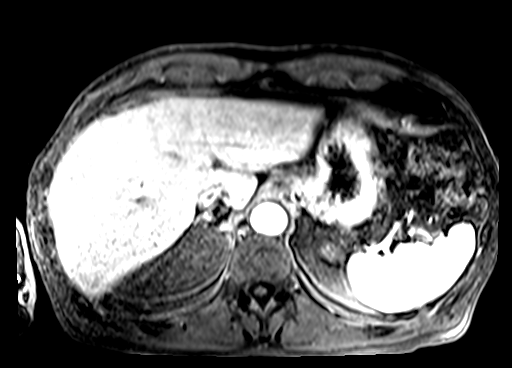
[im 96/96]
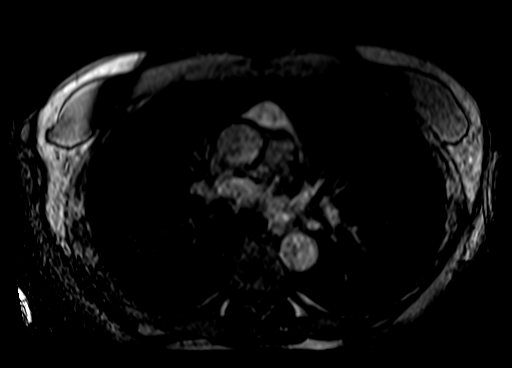

[Series 18: post 25 sec_sub · axial · 2.5mm · 0.74mm/px · z∈[-92,+146]mm · 3 of 96 slices shown]
[im 1/96]
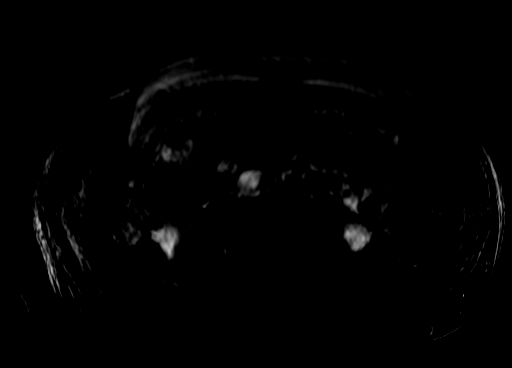
[im 48/96]
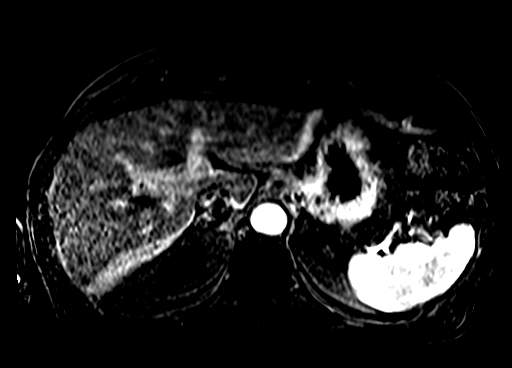
[im 96/96]
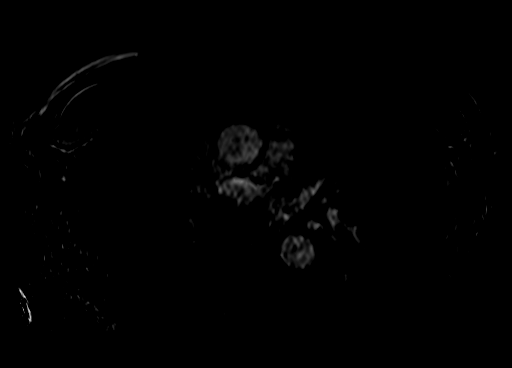

[Series 19: post 45 sec · axial · 2.5mm · 0.74mm/px · z∈[-92,+146]mm · 3 of 96 slices shown]
[im 1/96]
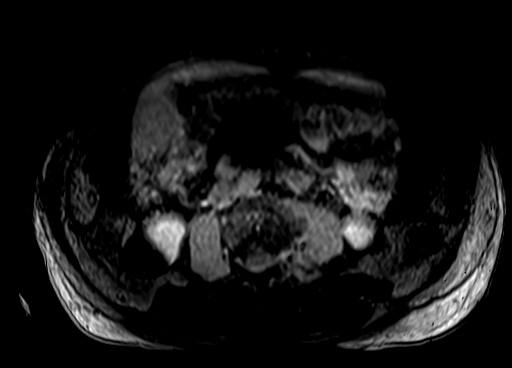
[im 48/96]
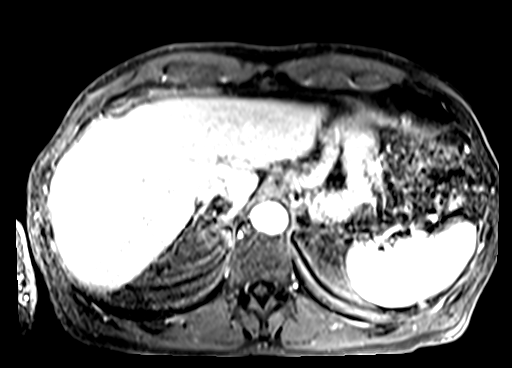
[im 96/96]
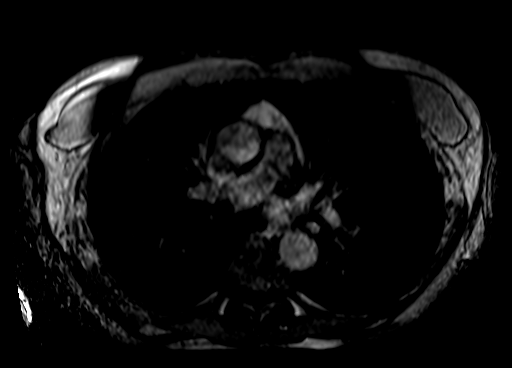

[Series 20: post 45 sec_sub · axial · 2.5mm · 0.74mm/px · 1 of 96 slices shown]
[im 1/96]
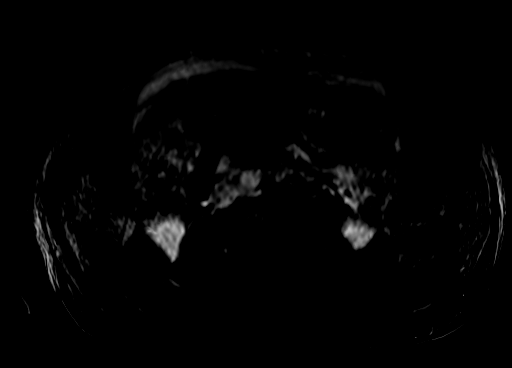

[29 of 48 positions shown; findings below may reference images not displayed]

FINDINGS: Portions of exam are mildly motion degraded.

Lower chest: Mild cardiomegaly, without pericardial or pleural
effusion.

Hepatobiliary: No focal liver lesion or intrahepatic biliary duct
dilatation. The gallbladder is mildly dilated, 9.7 cm. No evidence
of acute cholecystitis or gallstones.

The common duct is normal in size for age proximally, 6 mm on image
26/series 13. There is at least mass-effect upon the common duct by
the pancreatic head mass. There is probable mild obstruction,
despite absence of significant dilatation. Example image 9/series
27.

Pancreas: Atrophy involves the pancreatic body and tail. Duct
dilatation is significantly progressive, including 11 mm on image
23/ series 3. Pancreatic duct dilatation continues to the level of a
pancreatic head/ uncinate process mass. This is hypoenhancing,
including at 3.8 x 3.3 cm on image 78/series 19.

Spleen:  Normal, without mass or ductal dilatation.

Adrenals/Urinary Tract: Normal adrenal glands. Bilateral renal
cysts. Mild left renal cortical thinning. No hydronephrosis.

Stomach/Bowel: Normal stomach and abdominal bowel loops.

Vascular/Lymphatic: The celiac is uninvolved with tumor. There is
probable involvement of the SMA, including proximally on image
72/series 17. More distally, the SMA is surrounded by tumor on the
right and adjacent necrotic adenopathy on the left. Example necrotic
node at 1.3 cm on image 78/series 17.

Other:  No ascites.

Musculoskeletal: Convex right lumbar spine curvature. Nonspecific 12
mm hyperenhancing L3 vertebral body lesion, present back in 8125.
Likely a hemangioma.
IMPRESSION: 1. Mildly motion degraded exam.
2. Development of pancreatic head/uncinate process mass, most
consistent with adenocarcinoma. Necrotic surrounding adenopathy.
3. SMA involvement is strongly favored. If the patient is considered
a surgical candidate otherwise, pancreatic protocol pre and post
contrast abdominal CT could confirm.
These results will be called to the ordering clinician or
representative by the Radiologist Assistant, and communication
documented in the PACS or zVision Dashboard.
